# Patient Record
Sex: Male | Born: 1970
Health system: Southern US, Community
[De-identification: ages and names within clinical notes are randomized; demographics above are authoritative.]

## PROBLEM LIST (undated history)

## (undated) DIAGNOSIS — K760 Fatty (change of) liver, not elsewhere classified: Secondary | ICD-10-CM

## (undated) DIAGNOSIS — E785 Hyperlipidemia, unspecified: Secondary | ICD-10-CM

## (undated) HISTORY — DX: Fatty (change of) liver, not elsewhere classified: K76.0

## (undated) HISTORY — PX: HERNIA REPAIR: SHX51

## (undated) HISTORY — PX: KNEE ARTHROSCOPY W/ MENISCAL REPAIR: SHX1877

## (undated) HISTORY — DX: Hyperlipidemia, unspecified: E78.5

---

## 2000-09-29 ENCOUNTER — Encounter: Payer: Self-pay | Admitting: *Deleted

## 2000-09-29 ENCOUNTER — Emergency Department (HOSPITAL_COMMUNITY): Admission: EM | Admit: 2000-09-29 | Discharge: 2000-09-29 | Payer: Self-pay | Admitting: *Deleted

## 2014-11-02 ENCOUNTER — Ambulatory Visit (INDEPENDENT_AMBULATORY_CARE_PROVIDER_SITE_OTHER): Payer: Commercial Managed Care - PPO | Admitting: Internal Medicine

## 2014-11-02 ENCOUNTER — Ambulatory Visit (INDEPENDENT_AMBULATORY_CARE_PROVIDER_SITE_OTHER): Payer: Commercial Managed Care - PPO

## 2014-11-02 VITALS — BP 120/78 | HR 78 | Temp 97.6°F | Resp 16 | Ht 73.25 in | Wt 248.4 lb

## 2014-11-02 DIAGNOSIS — B070 Plantar wart: Secondary | ICD-10-CM

## 2014-11-02 DIAGNOSIS — M79672 Pain in left foot: Secondary | ICD-10-CM

## 2014-11-02 DIAGNOSIS — L989 Disorder of the skin and subcutaneous tissue, unspecified: Secondary | ICD-10-CM | POA: Diagnosis not present

## 2014-11-02 MED ORDER — SALICYLIC ACID 40 % EX PADS: MEDICATED_PAD | CUTANEOUS | Status: DC

## 2014-11-02 NOTE — Progress Notes (Signed)
   Subjective:    Patient ID: Johnathan Bailey, male    DOB: 1970-08-15, 44 y.o.   MRN: [id number]  HPI I, Jule Ser R.T. (R), am scribing for Dr. Lou Miner.   Johnathan Bailey is a 44 y.o. Male presenting today with complaints of left foot pain. He has 2 callused wounds on the plantar surface of his heel. He states they has been present for about 2 months. He denies any knowledge of insect bites or stepping on anything that may have punctured his foot. He states they are very painful. He has used OTC wart cushions that didn't seem to work. He claims the size has expands since he first noticed it.     Review of Systems     Objective:   Physical Exam        Assessment & Plan:

## 2014-11-02 NOTE — Patient Instructions (Signed)
Verrugas plantares (Plantar Wart) Las verrugas son abultamientos benignos (no cancerosos) en la capa exterior de la piel. Pueden aparecer en cualquier momento de la vida, pero son ms frecuentes durante la infancia y Ship broker. Las Building control surveyor en varias zonas de la piel. Cuando se forman en la zona inferior del pie (planta), se denominan verrugas plantares. Con frecuencia aparecen en grupos con varias verrugas pequeas que rodean a una ms grande. CAUSAS El virus del Engineer, technical sales (VPH) es la causa de las verrugas plantares. Este virus se introduce en una herida de la piel del pie. Al caminar descalzo hay una exposicin al virus de la verruga. Las Development worker, international aid plantares tienden a Teaching laboratory technician las zonas que reciben mayor presin, como los talones y la protuberancia que se encuentra debajo del dedo gordo del pie. Las verrugas generalmente se desarrollan en las capas ms profundas de la piel. Pueden diseminarse a otras reas de la planta, pero no pueden contagiar otras zonas del cuerpo. SNTOMAS Tambin puede notar un abultamiento debajo de la superficie del pie. La verruga puede desarrollarse directamente en la planta del pie, o elevarse sobre la superficie de la piel de la planta, o en ambos lugares. Generalmente son planas debido a la presin. En general no causan picazn, pero pueden causar dolor en la zona, al colocar el [employer]. DIAGNSTICO El diagnstico se realiza a travs del examen fsico. Esto significa que el mdico puede descubrirlas al examinar su pie.  TRATAMIENTO Hay muchas formas de tratar las verrugas plantares. [place] ser muy resistentes. En algunos casos es difcil tratarlas pero pueden desaparecer completamente y no volver a crecer. Cualquier tratamiento, para que tenga [place], debe realizarse con regularidad. Si no se tratan, finalmente desaparecern luego de un perodo de [employer]. Los tratamientos caseros pueden ser:  Se ha  demostrado que colocar una cinta adhesiva sobre la superficie de la verruga (oclusin) durante algunos meses, puede ser una solucin efectiva. La cinta adhesiva debe retirarse todas las noches y volverse a Midwife hasta que la verruga haya desaparecido.  Puede ser [employer] aplicar medicamentos de venta libre sobre la verruga para destruir el virus y eliminar el tejido de la verruga (cido acetilsaliclico, cantaridina y Nurse, learning disability). Todos ellos se denominan agentes queratolticos. Estos medicamentos hacen que la piel se [place] y que las capas se desprendan. Estas sustancias deben colocarse sobre la verruga todas las noches y [place] deben cubrirse con [place]. Tambin se dispone de curitas con medicamento. Evite aplicar estos medicamentos en forma lquida en la zona que rodea la piel de las verrugas, ya que pueden causar quemaduras en la piel sana. El tratamiento de aplicacin nocturna puede durar varios meses hasta que produzca algn [place].  Recientemente, la crioterapia como tratamiento para congelar las [place], ha aparecido como medicamento de venta libre para ser utilizado en nios de [place] de 4 aos. En este sistema se utiliza un aplicador delicado y [place], que se conecta a un frasco de lquido fro para ser aplicado directamente sobre la verruga. Este medicamento puede quemar la piel sana y debe utilizarse con precaucin.  Al igual que con otros medicamentos de venta libre, lea cuidadosamente las instrucciones antes de [place]. Los tratamientos que se Psychologist, forensic mdico son:  Algunos tratamientos agresivos pueden causar molestias, cambios en el tono y cicatrices en la piel que rodea la verruga. Los riesgos y beneficios del tratamiento deben comentarse con el mdico.  El congelamiento de la verruga  con nitrgeno lquido (vase ms arriba, crioterapia).  Quemado de la verruga con el uso de calor muy intenso (cauterizacin).  Inyeccin de medicamentos en la  verruga.  Extirpacin [place] o tratamiento con rayo lser.  El mdico podr derivarlo a un dermatlogo si es difcil de tratar, el tamao es muy grande o tiene Education administrator. INSTRUCCIONES PARA EL CUIDADO DOMICILIARIO  Remoje la zona afectada en agua tibia. Luego seque completamente la zona. Retire la capa superficial de piel blanda y aplique el medicamento tpico que haya elegido y vuelva a aplicar el vendaje.  Retire el vendaje todos los das y lime el exceso de tejido de la verruga (la piedra pmez es adecuada para este propsito). Repita el proceso diariamente o da por medio durante algunas semanas hasta que la verruga desaparezca.  Hay algunas marcas disponibles de apsitos de cido saliclico como medicamento de [place].  El dolor puede aliviarse si utiliza un vendaje en forma de rosquilla. Es un vendaje con un Music therapist. Debe colocarlo con el agujero sobre la verruga. Ayuda a quitar presin [employer] y Geographical information systems officer. Para prevenir las verrugas plantares:  Use zapatos y calcetines y cmbielos diariamente.  Mantenga los pies limpios y secos.  Controle sus pies y los pies de sus nios con regularidad.  Evite el contacto directo con las verrugas de Producer, television/film/video.  Consulte al mdico si observa abultamientos o modificaciones en la piel. Document Released: 07/13/2005 Document Revised: 10/05/2011 [facility] Patient Information 2015 [place]. This information is not intended to replace advice given to you by your health care provider. Make sure you discuss any questions you have with your health care provider. Plantar Warts Warts are benign (noncancerous) growths of the outer skin layer. They can occur at any time in life but are most common during childhood and the teen years. Warts can occur on many skin surfaces of the body. When they occur on the underside (sole) of your foot they are called plantar warts. They often emerge in groups with several  small warts encircling a larger growth. CAUSES  Human papillomavirus (HPV) is the cause of plantar warts. HPV attacks a break in the skin of the foot. Walking barefoot can lead to exposure to the wart virus. Plantar warts tend to develop over areas of pressure such as the heel and ball of the foot. Plantar warts often grow into the deeper layers of skin. They may spread to other areas of the sole but cannot spread to other areas of the body. SYMPTOMS  You may also notice a growth on the undersurface of your foot. The wart may grow directly into the sole of the foot, or rise above the surface of the skin on the sole of the foot, or both. They are most often flat from pressure. Warts generally do not cause itching but may cause pain in the area of the wart when you put weight on your foot. DIAGNOSIS  Diagnosis is made by physical examination. This means your caregiver discovers it while examining your foot.  TREATMENT  There are many ways to treat plantar warts. However, warts are very tough. Sometimes it is difficult to treat them so that they go away completely and do not grow back. Any treatment must be done regularly to work. If left untreated, most plantar warts will eventually disappear over a period of one to two years. Treatments you can do at home include:  Putting duct tape over the top of the  wart (occlusion) has been found to be effective over several months. The duct tape should be removed each night and reapplied until the wart has disappeared.  Placing over-the-counter medications on top of the wart to help kill the wart virus and remove the wart tissue (salicylic acid, cantharidin, and dichloroacetic acid) are useful. These are called keratolytic agents. These medications make the skin soft and gradually layers will shed away. These compounds are usually placed on the wart each night and then covered with a bandage. They are also available in premedicated bandage form. Avoid surrounding  skin when applying these liquids as these medications can burn healthy skin. The treatment may take several months of nightly use to be effective.  Cryotherapy to freeze the wart has recently become available over-the-counter for children 4 years and older. This system makes use of a soft narrow applicator connected to a bottle of compressed cold liquid that is applied directly to the wart. This medication can burn healthy skin and should be used with caution.  As with all over-the-counter medications, read the directions carefully before use. Treatments generally done in your caregiver's office include:  Some aggressive treatments may cause discomfort, discoloration, and scarring of the surrounding skin. The risks and benefits of treatment should be discussed with your caregiver.  Freezing the wart with liquid nitrogen (cryotherapy, see above).  Burning the wart with use of very high heat (cautery).  Injecting medication into the wart.  Surgically removing or laser treatment of the wart.  Your caregiver may refer you to a dermatologist for difficult to treat large-sized warts or large numbers of warts. HOME CARE INSTRUCTIONS   Soak the affected area in warm water. Dry the area completely when you are done. Remove the top layer of softened skin, then apply the chosen topical medication and reapply a bandage.  Remove the bandage daily and file excess wart tissue (pumice stone works well for this purpose). Repeat the entire process daily or every other day for weeks until the plantar wart disappears.  Several brands of salicylic acid pads are available as over-the-counter remedies.  Pain can be relieved by wearing a donut bandage. This is a bandage with a hole in it. The bandage is put on with the hole over the wart. This helps take the pressure off the wart and gives pain relief. To help prevent plantar warts:  Wear shoes and socks and change them daily.  Keep feet clean and  dry.  Check your feet and your children's feet regularly.  Avoid direct contact with warts on other people.  Have growths or changes on your skin checked by your caregiver. Document Released: 10/03/2003 Document Revised: 11/27/2013 Document Reviewed: 03/13/2009 [facility] Patient Information 2015 [place], [place]. This information is not intended to replace advice given to you by your health care provider. Make sure you discuss any questions you have with your health care provider.

## 2014-11-02 NOTE — Progress Notes (Signed)
   Subjective:    Patient ID: Johnathan Bailey, male    DOB: 23-Feb-1971, 44 y.o.   MRN: [id number]  HPI Has tender lesion on foot for a long time. Has used salicylic acid otc with some minimal imptovement. Only one lesion.   Review of Systems     Objective:   Physical Exam  Constitutional: He is oriented to person, place, and time. He appears well-developed and well-nourished. No distress.  HENT:  Head: Normocephalic.  Eyes: Pupils are equal, round, and reactive to light. No scleral icterus.  Neck: Normal range of motion.  Pulmonary/Chest: Effort normal.  Neurological: He is alert and oriented to person, place, and time. He exhibits normal muscle tone. Coordination normal.  Skin: Lesion and rash noted. Rash is papular.  64mm lesion mid left calcaneal pad. Classic plantar wart Calloused, no cellulitis  Psychiatric: He has a normal mood and affect.     [facility] reading (PRIMARY) by  Dr.Khushbu Pippen no fb seen       Assessment & Plan:  Plantar wart--single Soaks/Pumis debride/Salysilic acid/Duct tape q 48hrs

## 2016-06-02 ENCOUNTER — Encounter (HOSPITAL_COMMUNITY): Payer: Self-pay | Admitting: Emergency Medicine

## 2016-06-02 ENCOUNTER — Emergency Department (HOSPITAL_COMMUNITY)
Admission: EM | Admit: 2016-06-02 | Discharge: 2016-06-02 | Disposition: A | Discharge status: Home / Self Care | Payer: Worker's Compensation | Attending: Emergency Medicine | Admitting: Emergency Medicine

## 2016-06-02 ENCOUNTER — Emergency Department (HOSPITAL_COMMUNITY): Payer: Worker's Compensation

## 2016-06-02 DIAGNOSIS — Y9301 Activity, walking, marching and hiking: Secondary | ICD-10-CM | POA: Insufficient documentation

## 2016-06-02 DIAGNOSIS — W010XXA Fall on same level from slipping, tripping and stumbling without subsequent striking against object, initial encounter: Secondary | ICD-10-CM | POA: Insufficient documentation

## 2016-06-02 DIAGNOSIS — Y9269 Other specified industrial and construction area as the place of occurrence of the external cause: Secondary | ICD-10-CM | POA: Insufficient documentation

## 2016-06-02 DIAGNOSIS — M25561 Pain in right knee: Secondary | ICD-10-CM | POA: Insufficient documentation

## 2016-06-02 DIAGNOSIS — Y990 Civilian activity done for income or pay: Secondary | ICD-10-CM | POA: Diagnosis not present

## 2016-06-02 MED ORDER — IBUPROFEN 600 MG PO TABS: 600.0000 mg | ORAL_TABLET | Freq: Four times a day (QID) | ORAL | 0 refills | Status: DC | PRN

## 2016-06-02 MED ORDER — HYDROCODONE-ACETAMINOPHEN 5-325 MG PO TABS: 1.0000 | ORAL_TABLET | Freq: Four times a day (QID) | ORAL | 0 refills | Status: DC | PRN

## 2016-06-02 MED ORDER — HYDROCODONE-ACETAMINOPHEN 5-325 MG PO TABS
2.0000 | ORAL_TABLET | Freq: Once | ORAL | Status: AC
  Administered 2016-06-02: 2 via ORAL
  Filled 2016-06-02: qty 2

## 2016-06-02 NOTE — ED Provider Notes (Signed)
[facility] DEPT Provider Note   CSN: [id number] Arrival date & time: 06/02/16  1402  By signing my name below, I, Higinio Plan, attest that this documentation has been prepared under the direction and in the presence of non-physician practitioner, Montine Circle, PA-C. Electronically Signed: Higinio Plan, Scribe. 06/02/2016. 3:42 PM.  History   Chief Complaint Chief Complaint  Patient presents with  . Knee Pain   The history is provided by the patient. No language interpreter was used.   HPI Comments: Johnathan Bailey is a 45 y.o. male who presents to the Emergency Department with a chief complaint of right knee pain s/p a fall that occurred this morning. 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 He notes he is able to ambulate with mild difficulty.   History reviewed. No pertinent past medical history.  There are no active problems to display for this patient.  History reviewed. No pertinent surgical history.  Home Medications    Prior to Admission medications   Medication Sig Start Date End Date Taking? Authorizing Provider  Salicylic Acid 40 % PADS Apply salicylic acid [id number] pad every 48hrs ,cover with duct tape. Soak and debride pryor to application [id number]   Orma Flaming, MD    Family History No family history on file.  Social History Social History  Substance Use Topics  . Smoking status: Never Smoker  . Smokeless tobacco: Not on file  . Alcohol use 0.0 oz/week     Allergies   Patient has no known allergies.  Review of Systems Review of Systems  Constitutional: Negative for fever.  Musculoskeletal: Positive for myalgias (right knee pain).   Physical Exam Updated Vital Signs BP 138/88   Pulse 83   Temp 98.1 F (36.7 C) (Oral)   Resp 18   SpO2 97%   Physical Exam Physical Exam  Constitutional: Pt appears well-developed and well-nourished. No distress.    HENT:  Head: Normocephalic and atraumatic.  Eyes: Conjunctivae are normal.  Neck: Normal range of motion.  Cardiovascular: Normal rate, regular rhythm and intact distal pulses.   Capillary refill < 3 sec  Pulmonary/Chest: Effort normal and breath sounds normal.  Musculoskeletal: Pt exhibits tenderness to palpation of right knee on the medial joint line, joint stability tests deferred 2/2 guarding. Pt exhibits no edema.  ROM: 4/5 limited by pain Neurological: Pt  is alert. Coordination normal.  Sensation 5/5 Strength 4/5 limited by pain  Skin: Skin is warm and dry. Pt is not diaphoretic.  No tenting of the skin  Psychiatric: Pt has a normal mood and affect.  Nursing note and vitals reviewed.  ED Treatments / Results  Labs (all labs ordered are listed, but only abnormal results are displayed) Labs Reviewed - No data to display  EKG  EKG Interpretation None       Radiology Dg Knee Complete 4 Views Right  Result Date: 06/02/2016 CLINICAL DATA:  Fall.  Knee injury EXAM: RIGHT KNEE - COMPLETE 4+ VIEW COMPARISON:  None. FINDINGS: No evidence of fracture, dislocation, or joint effusion. No evidence of arthropathy or other focal bone abnormality. Soft tissues are unremarkable. IMPRESSION: Negative. Electronically Signed   By: Franchot Gallo M.D.   On: 06/02/2016 15:22    Procedures Procedures (including critical care time)  Medications Ordered in ED Medications - No data to display  DIAGNOSTIC STUDIES:  Oxygen Saturation is 97% on RA, normal by  my interpretation.    COORDINATION OF CARE:  3:38 PM Discussed treatment plan with pt at bedside and pt agreed to plan.  Initial Impression / Assessment and Plan / ED Course  I have reviewed the triage vital signs and the nursing notes.  Pertinent labs & imaging results that were available during my care of the patient were reviewed by me and considered in my medical decision making (see chart for details).  Clinical Course      Patient X-Ray negative for obvious fracture or dislocation.  Pt advised to follow up with orthopedics. Patient given knee brace and crutches while in ED, conservative therapy recommended and discussed. Patient will be discharged home & is agreeable with above plan. Returns precautions discussed. Pt appears safe for discharge.   I personally performed the services described in this documentation, which was scribed in my presence. The recorded information has been reviewed and is accurate.   Final Clinical Impressions(s) / ED Diagnoses   Final diagnoses:  Acute pain of right knee    New Prescriptions New Prescriptions   HYDROCODONE-ACETAMINOPHEN (NORCO/VICODIN) 5-325 MG TABLET    Take 1-2 tablets by mouth every 6 (six) hours as needed.   IBUPROFEN (ADVIL,MOTRIN) 600 MG TABLET    Take 1 tablet (600 mg total) by mouth every 6 (six) hours as needed.     Montine Circle, PA-C 06/02/16 1546    Tanna Furry, MD 06/23/16 1524

## 2016-06-02 NOTE — ED Notes (Signed)
Declined W/C at D/C and was escorted to lobby by RN. 

## 2016-06-02 NOTE — ED Notes (Signed)
Pt states he slipped this am, twisting right knee.

## 2016-06-02 NOTE — Discharge Instructions (Signed)
Your may need more evaluation on your knee by a specialist if your symptoms do not improve.  Please call the doctor listed below if still having symptoms after a week.

## 2016-06-02 NOTE — ED Notes (Signed)
Patient transported to X-ray 
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
## 2016-06-02 NOTE — ED Triage Notes (Signed)
Pt was walking up a step while it was raining, he slipped, twisted his right knee and then fell on his rear end. Pt c/o R knee pain, able to ambulate with mild pain.

## 2016-12-07 ENCOUNTER — Encounter: Payer: Self-pay | Admitting: Urgent Care

## 2016-12-07 ENCOUNTER — Ambulatory Visit (INDEPENDENT_AMBULATORY_CARE_PROVIDER_SITE_OTHER): Payer: BLUE CROSS/BLUE SHIELD

## 2016-12-07 ENCOUNTER — Ambulatory Visit (INDEPENDENT_AMBULATORY_CARE_PROVIDER_SITE_OTHER): Payer: BLUE CROSS/BLUE SHIELD | Admitting: Urgent Care

## 2016-12-07 VITALS — BP 128/88 | HR 80 | Temp 98.8°F | Resp 16 | Ht 72.5 in | Wt 251.6 lb

## 2016-12-07 DIAGNOSIS — S97102A Crushing injury of unspecified left toe(s), initial encounter: Secondary | ICD-10-CM

## 2016-12-07 DIAGNOSIS — S99922A Unspecified injury of left foot, initial encounter: Secondary | ICD-10-CM | POA: Diagnosis not present

## 2016-12-07 DIAGNOSIS — S92425A Nondisplaced fracture of distal phalanx of left great toe, initial encounter for closed fracture: Secondary | ICD-10-CM

## 2016-12-07 DIAGNOSIS — S99929A Unspecified injury of unspecified foot, initial encounter: Secondary | ICD-10-CM

## 2016-12-07 MED ORDER — CEPHALEXIN 500 MG PO CAPS: 500.0000 mg | ORAL_CAPSULE | Freq: Three times a day (TID) | ORAL | 0 refills | Status: DC

## 2016-12-07 MED ORDER — TRAMADOL HCL 50 MG PO TABS: 50.0000 mg | ORAL_TABLET | Freq: Three times a day (TID) | ORAL | 0 refills | Status: DC | PRN

## 2016-12-07 NOTE — Progress Notes (Signed)
  MRN: [id number] DOB: 1970-11-26  Subjective:   Johnathan Bailey is a 46 y.o. male presenting for chief complaint of left great toenail (tire fell on foot on Sat. night 12/05/16)  Reports 2 day history of recurrent left great toe pain following another injury to said toe. Patient reports that while working in the garage, a tire fell onto the floor then landed on his left great toe. Has had pain, swelling, bruising since then. He had also injured his toe in December 2017, dropped a drill on it. His great toenail has steadily been falling off since then. He is requesting that we remove his great toe nail today. Has not tried any medications for relief. Denies fever, redness, loss of sensation, warmth.   Johnathan Bailey has a current medication list which includes the following prescription(s): ibuprofen, hydrocodone-acetaminophen, and salicylic acid. Also has No Known Allergies.  Johnathan Bailey  has no past medical history on file. Also  has no past surgical history on file.  Objective:   Vitals: BP 128/88   Pulse 80   Temp 98.8 F (37.1 C) (Oral)   Resp 16   Ht 6' 0.5" (1.842 m)   Wt 251 lb 9.6 oz (114.1 kg)   SpO2 97%   BMI 33.65 kg/m   Physical Exam  Constitutional: He is oriented to person, place, and time. He appears well-developed and well-nourished.  Cardiovascular: Normal rate.   Pulmonary/Chest: Effort normal.  Musculoskeletal:       Left foot: There is decreased range of motion (flexion, extension due to pain per patient) and swelling.       Feet:  Neurological: He is alert and oriented to person, place, and time.   Dg Toe Great Left  Result Date: 12/07/2016 CLINICAL DATA:  Toe injury, crush injury. EXAM: LEFT GREAT TOE COMPARISON:  11/02/2014 FINDINGS: Linear lucency through the base of the left great toe distal phalanx concerning for nondisplaced fracture. This involves the IP joint. No subluxation or dislocation. Joint spaces are maintained. IMPRESSION: Nondisplaced fracture through the  distal phalanx of the left great toe entering the IP joint. Electronically Signed   By: Rolm Baptise M.D.   On: 12/07/2016 12:21   PROCEDURE NOTE: Toenail Removal  Verbal Consent Obtained. Left great toe wiped with alcohol prep pad, then digital block with 8cc of 0.5% Marcaine. Sterile prep and drape. Non-viable left great toenail removed with reduction of subungual hematoma in the process. New toenail was noted to be loose as well. This was also removed and cleansed in sterile water. Newer nail was placed back onto great toe as a splint for protection. Non-viable tissue debrided. No active bleeding. Cleansed and dressed. Wound care instructions including precautions reviewed with patient.   Assessment and Plan :   This case was precepted with Dr. Mitchel Honour.   1. Toe injury, left, initial encounter 2. Crushing injury of toe of left foot, initial encounter 3. Injury of nail bed of toe 4. Nondisplaced fracture of distal phalanx of left great toe, initial encounter for closed fracture - Counseled on toe fracture management. Start Keflex. Use hard soled shoe. 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   Jaynee Eagles, PA-C [facility] [phone] 12/07/2016  11:16 AM

## 2016-12-07 NOTE — Patient Instructions (Addendum)
Tome 500mg  de Tylenol con ibuprofen 400-600mg  cada 6 horas con comida para dolor y inflammacion.     Fractura de un dedo del pie (Toe Fracture) Una fractura de un dedo del pie es una quebradura en uno de los huesos de los dedos del pie (falanges). CAUSAS Esta afeccin puede ser causada por lo siguiente:  La cada de un objeto pesado sobre el dedo del pie.  Un golpe en el dedo del pie.  El uso excesivo del dedo del pie o la realizacin de ejercicios repetitivos.  La torsin o el estiramiento del dedo del pie con desplazamiento. FACTORES DE RIESGO Es ms probable que esta afeccin se manifieste en las personas que:  Therapist, occupational deportes de Diplomatic Services operational officer.  Tienen una enfermedad [employer].  Tienen bajos niveles de calcio. SNTOMAS Los principales sntomas de esta afeccin son la hinchazn y el dolor del dedo del pie. El dolor puede intensificarse al estar parado o caminar. Otros sntomas pueden ser los siguientes:  Hematomas.  Rigidez.  Entumecimiento.  Un cambio en el aspecto del dedo del pie.  Huesos fracturados que [employer].  Sangre debajo de la ua del pie. DIAGNSTICO Esta afeccin se diagnostica mediante un examen fsico. Tambin puede ser necesario que le tomen radiografas. TRATAMIENTO El tratamiento de esta afeccin depende del tipo de fractura y de la gravedad. El tratamiento puede incluir lo siguiente:  Vendar el dedo fracturado del pie junto con un dedo adyacente (vendaje de inmovilizacin). Este es el tratamiento ms frecuente de las fracturas en las que el hueso no se ha desplazado de su lugar (fractura sin desplazamiento).  Usar un calzado con suela ancha y rgida para proteger el dedo del pie y limitar su movimiento.  Usar un yeso para caminar.  Someterse a un procedimiento para reacomodar el dedo del pie.  Ciruga. Esta puede ser necesaria en los siguientes casos:  Si hay muchos fragmentos de hueso fracturado que estn fuera de su lugar  (desplazados).  Si se fractura la articulacin del dedo del pie.  Si el hueso atraviesa la piel.  Fisioterapia. Esta se realiza para ayudar a Estate manager/land agent movimiento y la fuerza del dedo del pie. Tal vez deba hacerse radiografas de control para asegurarse de que el hueso se est consolidando bien y se Quarry manager en su posicin. INSTRUCCIONES PARA EL CUIDADO EN EL HOGAR Si tiene un yeso:  No introduzca nada adentro del yeso para rascarse la piel. Esto puede aumentar el riesgo de tener infecciones.  De Land piel de alrededor del yeso. Informe al mdico cualquier inquietud que tenga. Puede aplicar una locin en la piel seca alrededor de los bordes del yeso. No aplique locin en la piel por debajo del yeso.  No ejerza presin en ninguna parte del yeso hasta que se haya endurecido por completo. Esto puede tomar [employer].  Mantenga el yeso seco y limpio. El bao  No tome baos de inmersin, no nade ni use el jacuzzi hasta que el mdico lo autorice. Pregntele al mdico si puede ducharse. Thurston Pounds solo le permitan tomar baos de [place].  Si el mdico lo autoriza a que tome baos de inmersin y se duche, [place] el yeso o la venda (vendaje) con una bolsa de plstico hermtica para protegerlos del agua. No permita que el yeso o el vendaje se mojen. Control del dolor, la rigidez y la hinchazn  Si no tiene un yeso, aplique hielo sobre la zona de la lesin, si se lo indicaron.  Ponga el hielo en una bolsa plstica.  Coloque una toalla entre la piel y la bolsa de hielo.  Coloque el hielo durante 8minutos, 2 a 3veces por Training and development officer.  Mueva los dedos de los pies con frecuencia para evitar que se entumezcan y para reducir la hinchazn.  Cuando est sentado o acostado, eleve la zona de la lesin por encima del nivel del corazn. Conducir  No conduzca ni opere maquinaria pesada mientras toma analgsicos.  No conduzca mientras [place] un yeso en un pie. Actividad  Reanude sus  actividades normales como se lo haya indicado el mdico. Pregntele al mdico qu actividades son seguras para usted.  Haga ejercicio a diario como se lo haya indicado el fisioterapeuta o el mdico. Seguridad  No apoye el peso del cuerpo sobre la extremidad lesionada hasta que lo autorice el mdico. Use muletas u otros dispositivos de Runner, broadcasting/film/video se lo haya indicado el mdico. Instrucciones generales  Si se trat el dedo con un vendaje de inmovilizacin, siga las indicaciones del mdico en lo que respecta al cambio de la gasa y la [place]. Cmbielas con ms frecuencia:  Si la gasa y la [place] se mojan. Si esto ocurre, seque el [employer].  Si la gasa y la cinta adhesiva estn muy ajustadas y hacen que el dedo del pie se torne plido o se entumezca.  Use calzado protector como se lo haya indicado el mdico. Si no le indicaron un calzado de este tipo, use uno que sea resistente y tenga buen 79. El calzado no debe comprimirle ni apretarle los dedos.  No consuma ningn producto que contenga tabaco, lo que incluye cigarrillos, tabaco de Higher education careers adviser o Psychologist, sport and exercise. El tabaco puede retardar la consolidacin de la fractura. Si necesita ayuda para dejar de fumar, consulte al mdico.  Tome los medicamentos solamente como se lo haya indicado el mdico.  Concurra a todas las visitas de control como se lo haya indicado el mdico. Esto es importante. SOLICITE ATENCIN MDICA SI:  Jaclynn Guarneri.  El medicamento no Production designer, theatre/television/film.  El dedo del pie est fro.  El dedo del pie est entumecido.  Sigue teniendo dolor despus de una semana de reposo y [place].  Sigue teniendo [employer] de que el mdico le haya indicado que puede empezar a caminar nuevamente.  Siente dolor, hormigueo o entumecimiento en el pie, y estos sntomas no desaparecen. SOLICITE ATENCIN MDICA DE INMEDIATO SI:  Siente dolor intenso.  Tiene enrojecimiento o inflamacin en el  dedo del pie, y estos sntomas empeoran.  Siente dolor o entumecimiento en el dedo del pie, y estos sntomas empeoran.  El dedo del pie se torna de color [place]. Esta informacin no tiene Marine scientist el consejo del mdico. Asegrese de hacerle al mdico cualquier pregunta que tenga. Document Released: 04/22/2005 Document Revised: 11/04/2015 Document Reviewed: 05/09/2014 Elsevier Interactive Patient Education  2017 [employer].     IF you received an x-ray today, you will receive an invoice from [facility]. Please contact [facility] at [phone] with questions or concerns regarding your invoice.   IF you received labwork today, you will receive an invoice from [facility]. Please contact [facility] at [phone] with questions or concerns regarding your invoice.   Our billing staff will not be able to assist you with questions regarding bills from these companies.  You will be contacted with the lab results as soon as they are available. The fastest way to get your results is to  activate your My Chart account. Instructions are located on the last page of this paperwork. If you have not heard from [place] regarding the results in 2 weeks, please contact this office.

## 2017-06-30 ENCOUNTER — Telehealth: Payer: Self-pay

## 2017-06-30 ENCOUNTER — Ambulatory Visit: Payer: BLUE CROSS/BLUE SHIELD | Admitting: Family Medicine

## 2017-06-30 ENCOUNTER — Encounter: Payer: Self-pay | Admitting: Family Medicine

## 2017-06-30 VITALS — BP 132/80 | HR 74 | Temp 98.7°F | Ht 72.5 in | Wt 253.4 lb

## 2017-06-30 DIAGNOSIS — M7741 Metatarsalgia, right foot: Secondary | ICD-10-CM | POA: Insufficient documentation

## 2017-06-30 DIAGNOSIS — Z0001 Encounter for general adult medical examination with abnormal findings: Secondary | ICD-10-CM | POA: Diagnosis not present

## 2017-06-30 DIAGNOSIS — Z0000 Encounter for general adult medical examination without abnormal findings: Secondary | ICD-10-CM

## 2017-06-30 LAB — URINALYSIS, ROUTINE W REFLEX MICROSCOPIC
BILIRUBIN URINE: NEGATIVE
KETONES UR: NEGATIVE
Leukocytes, UA: NEGATIVE
Nitrite: NEGATIVE
PH: 5.5 (ref 5.0–8.0)
RBC / HPF: NONE SEEN (ref 0–?)
Specific Gravity, Urine: 1.025 (ref 1.000–1.030)
TOTAL PROTEIN, URINE-UPE24: NEGATIVE
URINE GLUCOSE: NEGATIVE
UROBILINOGEN UA: 0.2 (ref 0.0–1.0)
WBC UA: NONE SEEN (ref 0–?)

## 2017-06-30 LAB — COMPREHENSIVE METABOLIC PANEL
ALT: 28 U/L (ref 0–53)
AST: 19 U/L (ref 0–37)
Albumin: 4.8 g/dL (ref 3.5–5.2)
Alkaline Phosphatase: 69 U/L (ref 39–117)
BILIRUBIN TOTAL: 1.3 mg/dL — AB (ref 0.2–1.2)
BUN: 17 mg/dL (ref 6–23)
CO2: 27 meq/L (ref 19–32)
Calcium: 9.3 mg/dL (ref 8.4–10.5)
Chloride: 101 mEq/L (ref 96–112)
Creatinine, Ser: 0.75 mg/dL (ref 0.40–1.50)
GFR: 119.26 mL/min (ref >60.00)
GLUCOSE: 99 mg/dL (ref 70–99)
Potassium: 4.1 mEq/L (ref 3.5–5.1)
Sodium: 137 mEq/L (ref 135–145)
Total Protein: 7.6 g/dL (ref 6.0–8.3)

## 2017-06-30 LAB — CBC
HCT: 47.5 % (ref 39.0–52.0)
HEMOGLOBIN: 16.2 g/dL (ref 13.0–17.0)
MCHC: 34.1 g/dL (ref 30.0–36.0)
MCV: 86.6 fl (ref 78.0–100.0)
Platelets: 291 10*3/uL (ref 150.0–400.0)
RBC: 5.49 Mil/uL (ref 4.22–5.81)
RDW: 12.6 % (ref 11.5–15.5)
WBC: 7 10*3/uL (ref 4.0–10.5)

## 2017-06-30 LAB — TSH: TSH: 1.64 u[IU]/mL (ref 0.35–4.50)

## 2017-06-30 LAB — LIPID PANEL
CHOL/HDL RATIO: 6
Cholesterol: 213 mg/dL — ABNORMAL HIGH (ref 0–200)
HDL: 36.4 mg/dL — AB (ref >39.00)
Triglycerides: 606 mg/dL — ABNORMAL HIGH (ref 0.0–149.0)

## 2017-06-30 LAB — LDL CHOLESTEROL, DIRECT: Direct LDL: 105 mg/dL

## 2017-06-30 MED ORDER — MELOXICAM 15 MG PO TABS: 15.0000 mg | ORAL_TABLET | Freq: Every day | ORAL | 1 refills | Status: DC

## 2017-06-30 NOTE — Progress Notes (Addendum)
Subjective:  Patient ID: Johnathan Bailey, male    DOB: 12/28/1970  Age: 46 y.o. MRN: [id number]  CC: [name] presents for establishment of care and evaluation of foot pain.  He is fasting this morning.  He tells of a past medical history of elevated triglycerides.  These have been treated with dietary therapy in the past but he admits current dietary indiscretion.  He is married and has 3 children, the oldest is 77.  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  His group actually built our building he tells me.  His parents are in their 38s and in good health.  His dad told him that he was in the early stages of diabetes.  Patient does not smoke and drinks 1 alcoholic drink daily.  For some time now he has been having pain in the balls of his feet particularly in the right foot.  There is been no injury.  He is status post meniscus repair of his right knee. Wears seat belts.   History Johnathan Bailey has no past medical history on file.   He has no past surgical history on file.   His family history is not on file.He reports that  has never smoked. he has never used smokeless tobacco. He reports that he drinks alcohol. He reports that he does not use drugs.  Outpatient Medications Prior to Visit  Medication Sig Dispense Refill  . cephALEXin (KEFLEX) 500 MG capsule Take 1 capsule (500 mg total) by mouth 3 (three) times daily. 30 capsule 0  . HYDROcodone-acetaminophen (NORCO/VICODIN) 5-325 MG tablet Take 1-2 tablets by mouth every 6 (six) hours as needed. (Patient not taking: Reported on 12/07/2016) 10 tablet 0  . ibuprofen (ADVIL,MOTRIN) 600 MG tablet Take 1 tablet (600 mg total) by mouth every 6 (six) hours as needed. 30 tablet 0  . Salicylic Acid 40 % PADS Apply salicylic acid 40% pad every 48hrs ,cover with duct tape. Soak and debride pryor to application (Patient not taking: Reported on 12/07/2016) 6 each 1  . traMADol (ULTRAM) 50 MG tablet Take 1 tablet  (50 mg total) by mouth every 8 (eight) hours as needed. 30 tablet 0   No facility-administered medications prior to visit.     ROS Review of Systems  Constitutional: Negative.   HENT: Negative.   Eyes: Negative.   Respiratory: Negative.   Cardiovascular: Negative.   Gastrointestinal: Negative.   Endocrine: Negative for polyphagia and polyuria.  Genitourinary: Negative for frequency.  Musculoskeletal: Positive for arthralgias.  Skin: Negative for color change, rash and wound.  Allergic/Immunologic: Negative for immunocompromised state.  Neurological: Negative for weakness and headaches.  Hematological: Negative.   Psychiatric/Behavioral: Negative.     Objective:  BP 132/80 (BP Location: Left Arm, Patient Position: Sitting, Cuff Size: Normal)   Pulse 74   Temp 98.7 F (37.1 C) (Oral)   Ht 6' 0.5" (1.842 m)   Wt 253 lb 6 oz (114.9 kg)   SpO2 97%   BMI 33.89 kg/m   Physical Exam  Constitutional: He is oriented to person, place, and time. He appears well-developed and well-nourished. No distress.  HENT:  Head: Normocephalic and atraumatic.  Right Ear: External ear normal.  Left Ear: External ear normal.  Mouth/Throat: Oropharynx is clear and moist. No oropharyngeal exudate.  Eyes: Conjunctivae are normal. Pupils are equal, round, and reactive to light. Right eye exhibits no discharge. Left eye exhibits no discharge. No scleral icterus.  Neck: Neck supple. No JVD present. No tracheal deviation present. No thyromegaly present.  Cardiovascular: Normal rate, regular rhythm and normal heart sounds.  Pulses:      Dorsalis pedis pulses are 2+ on the right side.       Posterior tibial pulses are 1+ on the right side.  Pulmonary/Chest: Effort normal and breath sounds normal. No stridor.  Abdominal: Soft. Bowel sounds are normal.  Musculoskeletal:       Right foot: There is tenderness. There is normal range of motion, normal capillary refill and no deformity.        Feet:  Lymphadenopathy:    He has no cervical adenopathy.  Neurological: He is alert and oriented to person, place, and time.  Skin: Skin is warm and dry. He is not diaphoretic.  Psychiatric: He has a normal mood and affect. His behavior is normal.      Assessment & Plan:   Johnathan Bailey was seen today for establish care.  Diagnoses and all orders for this visit:  Metatarsalgia of right foot -     meloxicam (MOBIC) 15 MG tablet; Take 1 tablet (15 mg total) by mouth daily. With food for 10 days and then as needed.  Healthcare maintenance -     CBC -     Comprehensive metabolic panel -     Lipid panel -     TSH -     Urinalysis, Routine w reflex microscopic -     HIV antibody   I have discontinued Johnathan Bailey's Salicylic Acid, HYDROcodone-acetaminophen, ibuprofen, cephALEXin, and traMADol. I am also having him start on meloxicam.  Meds ordered this encounter  Medications  . meloxicam (MOBIC) 15 MG tablet    Sig: Take 1 tablet (15 mg total) by mouth daily. With food for 10 days and then as needed.    Dispense:  30 tablet    Refill:  1   Advised patient to obtain metatarsal pads at the pharmacy.  I discussed with them where they should be placed.  Discussed application of his pes planus foot.  Will offer referral to our sports medicine doctor if these do not help his foot pain.  Labs are pending and we will be particularly interested in his triglycerides.  Follow-up: Return in about 2 weeks (around 07/14/2017), or if symptoms worsen or fail to improve.  [name], MD

## 2017-06-30 NOTE — Telephone Encounter (Signed)
I called patient and we went over his lab results. He is wanting to know which pad you recommend that he gets for his foot. He is wanting to know if he should get the Dr. Felicie Morn and if so, which one is the best one to get.    Please advise, thanks!

## 2017-07-01 LAB — HIV ANTIBODY (ROUTINE TESTING W REFLEX): HIV 1&2 Ab, 4th Generation: NONREACTIVE

## 2017-07-01 NOTE — Telephone Encounter (Signed)
I left a voicemail for patient to return phone call. Per Dr. Ethelene Hal - patient can pick up metatarsal pads. If unable to find them, he can use the general Dr. Felicie Morn insert.

## 2017-07-02 NOTE — Telephone Encounter (Signed)
Patient has been given recommendations from Dr. Ethelene Hal and will call back if he has any questions.

## 2017-12-07 ENCOUNTER — Encounter: Payer: Self-pay | Admitting: Family Medicine

## 2017-12-07 ENCOUNTER — Ambulatory Visit: Payer: BLUE CROSS/BLUE SHIELD | Admitting: Family Medicine

## 2017-12-07 VITALS — BP 126/82 | HR 78 | Ht 72.5 in | Wt 256.0 lb

## 2017-12-07 DIAGNOSIS — S161XXA Strain of muscle, fascia and tendon at neck level, initial encounter: Secondary | ICD-10-CM | POA: Insufficient documentation

## 2017-12-07 MED ORDER — METHOCARBAMOL 500 MG PO TABS: 500.0000 mg | ORAL_TABLET | Freq: Three times a day (TID) | ORAL | 1 refills | Status: DC

## 2017-12-07 MED ORDER — MELOXICAM 15 MG PO TABS: 15.0000 mg | ORAL_TABLET | Freq: Every day | ORAL | 0 refills | Status: DC

## 2017-12-07 NOTE — Progress Notes (Signed)
Subjective:  Patient ID: Johnathan Bailey, male    DOB: June 10, 1971  Age: 47 y.o. MRN: [id number]  CC: pain in left side of neck   HPI Johnathan Bailey presents for evaluation of the of the pain in his left lateral neck.  He believes that he injured it a few months ago while cheering his son on at his soccer game.  His son was knocked down by another player patient became upset and thinks that he strained his neck as a result.  Denies traumatic injury.  There is no radiation of pain.  There is no weakness or paresthesias in the upper extremities.  There are no headaches.  He has tried no medicines for this.  No fever or chills or rash.  He is recovering from a recent viral syndrome.  Outpatient Medications Prior to Visit  Medication Sig Dispense Refill  . meloxicam (MOBIC) 15 MG tablet Take 1 tablet (15 mg total) by mouth daily. With food for 10 days and then as needed. 30 tablet 1   No facility-administered medications prior to visit.     ROS Review of Systems  Constitutional: Negative for chills, fever and unexpected weight change.  HENT: Negative.   Eyes: Negative.   Respiratory: Negative.   Cardiovascular: Negative.   Gastrointestinal: Negative.   Musculoskeletal: Positive for myalgias, neck pain and neck stiffness.  Skin: Negative for color change and rash.  Allergic/Immunologic: Negative for immunocompromised state.  Neurological: Negative for weakness, numbness and headaches.  Hematological: Does not bruise/bleed easily.  Psychiatric/Behavioral: Negative.     Objective:  BP 126/82   Pulse 78   Ht 6' 0.5" (1.842 m)   Wt 256 lb (116.1 kg)   SpO2 98%   BMI 34.24 kg/m   BP Readings from Last 3 Encounters:  12/07/17 126/82  06/30/17 132/80  12/07/16 128/88    Wt Readings from Last 3 Encounters:  12/07/17 256 lb (116.1 kg)  06/30/17 253 lb 6 oz (114.9 kg)  12/07/16 251 lb 9.6 oz (114.1 kg)    Physical Exam  Constitutional: He is oriented to person,  place, and time. He appears well-developed and well-nourished. No distress.  HENT:  Head: Normocephalic and atraumatic.  Right Ear: External ear normal.  Mouth/Throat: Oropharynx is clear and moist. No oropharyngeal exudate.  Eyes: Pupils are equal, round, and reactive to light. Conjunctivae and EOM are normal. Right eye exhibits no discharge. Left eye exhibits no discharge. No scleral icterus.  Neck: Normal range of motion. Neck supple. No JVD present. No tracheal deviation present. No thyromegaly present.  Cardiovascular: Normal rate, regular rhythm and normal heart sounds.  Pulmonary/Chest: Effort normal and breath sounds normal.  Abdominal: Bowel sounds are normal.  Musculoskeletal: He exhibits no edema or deformity.       Right shoulder: Normal.       Left shoulder: Normal.       Cervical back: He exhibits pain (mild ttp of lef tlateral sternoclidomastoid muscle.). He exhibits normal range of motion, no tenderness, no bony tenderness, no swelling and no deformity.  Lymphadenopathy:    He has no cervical adenopathy.  Neurological: He is alert and oriented to person, place, and time.  Skin: Skin is warm and dry. He is not diaphoretic.  Psychiatric: He has a normal mood and affect. His behavior is normal.    Lab Results  Component Value Date   WBC 7.0 06/30/2017   HGB 16.2 06/30/2017   HCT 47.5 06/30/2017   PLT  291.0 06/30/2017   GLUCOSE 99 06/30/2017   CHOL 213 (H) 06/30/2017   TRIG (H) 06/30/2017    606.0 Triglyceride is over 400; calculations on Lipids are invalid.   HDL 36.40 (L) 06/30/2017   LDLDIRECT 105.0 06/30/2017   ALT 28 06/30/2017   AST 19 06/30/2017   NA 137 06/30/2017   K 4.1 06/30/2017   CL 101 06/30/2017   CREATININE 0.75 06/30/2017   BUN 17 06/30/2017   CO2 27 06/30/2017   TSH 1.64 06/30/2017    Dg Knee Complete 4 Views Right  Result Date: 06/02/2016 CLINICAL DATA:  Fall.  Knee injury EXAM: RIGHT KNEE - COMPLETE 4+ VIEW COMPARISON:  None. FINDINGS: No  evidence of fracture, dislocation, or joint effusion. No evidence of arthropathy or other focal bone abnormality. Soft tissues are unremarkable. IMPRESSION: Negative. Electronically Signed   By: Franchot Gallo M.D.   On: 06/02/2016 15:22    Assessment & Plan:   [place] was seen today for pain in left side of neck.  Diagnoses and all orders for this visit:  Cervical muscle strain, initial encounter -     methocarbamol (ROBAXIN) 500 MG tablet; Take 1 tablet (500 mg total) by mouth 3 (three) times daily. For 2 weeks. -     meloxicam (MOBIC) 15 MG tablet; Take 1 tablet (15 mg total) by mouth daily. For 2 weeks.   I have discontinued Johnathan Bailey's meloxicam. I am also having him start on methocarbamol and meloxicam.  Meds ordered this encounter  Medications  . methocarbamol (ROBAXIN) 500 MG tablet    Sig: Take 1 tablet (500 mg total) by mouth 3 (three) times daily. For 2 weeks.    Dispense:  60 tablet    Refill:  1  . meloxicam (MOBIC) 15 MG tablet    Sig: Take 1 tablet (15 mg total) by mouth daily. For 2 weeks.    Dispense:  30 tablet    Refill:  0   Patient was instructed to take Mobic and Robaxin regularly for 2 weeks.  He will also follow with the neck exercises given to him.  He will let me know if he is not better in 2 weeks and will consider sports medicine referral.  Follow-up: No follow-ups on file.  [name], MD

## 2017-12-07 NOTE — Patient Instructions (Signed)
Cervical Strain and Sprain Rehab Ask your health care provider which exercises are safe for you. Do exercises exactly as told by your health care provider and adjust them as directed. It is normal to feel mild stretching, pulling, tightness, or discomfort as you do these exercises, but you should stop right away if you feel sudden pain or your pain gets worse.Do not begin these exercises until told by your health care provider. Stretching and range of motion exercises These exercises warm up your muscles and joints and improve the movement and flexibility of your neck. These exercises also help to relieve pain, numbness, and tingling. Exercise A: Cervical side bend  1. Using good posture, sit on a stable chair or stand up. 2. Without moving your shoulders, slowly tilt your left / right ear to your shoulder until you feel a stretch in your neck muscles. You should be looking straight ahead. 3. Hold for __________ seconds. 4. Repeat with the other side of your neck. Repeat __________ times. Complete this exercise __________ times a day. Exercise B: Cervical rotation  1. Using good posture, sit on a stable chair or stand up. 2. Slowly turn your head to the side as if you are looking over your left / right shoulder. ? Keep your eyes level with the ground. ? Stop when you feel a stretch along the side and the back of your neck. 3. Hold for __________ seconds. 4. Repeat this by turning to your other side. Repeat __________ times. Complete this exercise __________ times a day. Exercise C: Thoracic extension and pectoral stretch 1. Roll a towel or a small blanket so it is about 4 inches (10 cm) in diameter. 2. Lie down on your back on a firm surface. 3. Put the towel lengthwise, under your spine in the middle of your back. It should not be not under your shoulder blades. The towel should line up with your spine from your middle back to your lower back. 4. Put your hands behind your head and let your  elbows fall out to your sides. 5. Hold for __________ seconds. Repeat __________ times. Complete this exercise __________ times a day. Strengthening exercises These exercises build strength and endurance in your neck. Endurance is the ability to use your muscles for a long time, even after your muscles get tired. Exercise D: Upper cervical flexion, isometric 1. Lie on your back with a thin pillow behind your head and a small rolled-up towel under your neck. 2. Gently tuck your chin toward your chest and nod your head down to look toward your feet. Do not lift your head off the pillow. 3. Hold for __________ seconds. 4. Release the tension slowly. Relax your neck muscles completely before you repeat this exercise. Repeat __________ times. Complete this exercise __________ times a day. Exercise E: Cervical extension, isometric  1. Stand about 6 inches (15 cm) away from a wall, with your back facing the wall. 2. Place a soft object, about 6-8 inches (15-20 cm) in diameter, between the back of your head and the wall. A soft object could be a small pillow, a ball, or a folded towel. 3. Gently tilt your head back and press into the soft object. Keep your jaw and forehead relaxed. 4. Hold for __________ seconds. 5. Release the tension slowly. Relax your neck muscles completely before you repeat this exercise. Repeat __________ times. Complete this exercise __________ times a day. Posture and body mechanics  Body mechanics refers to the movements and positions of   your body while you do your daily activities. Posture is part of body mechanics. Good posture and healthy body mechanics can help to relieve stress in your body's tissues and joints. Good posture means that your spine is in its natural S-curve position (your spine is neutral), your shoulders are pulled back slightly, and your head is not tipped forward. The following are general guidelines for applying improved posture and body mechanics to  your everyday activities. Standing  When standing, keep your spine neutral and keep your feet about hip-width apart. Keep a slight bend in your knees. Your ears, shoulders, and hips should line up.  When you do a task in which you stand in one place for a long time, place one foot up on a stable object that is 2-4 inches (5-10 cm) high, such as a footstool. This helps keep your spine neutral. Sitting   When sitting, keep your spine neutral and your keep feet flat on the floor. Use a footrest, if necessary, and keep your thighs parallel to the floor. Avoid rounding your shoulders, and avoid tilting your head forward.  When working at a desk or a computer, keep your desk at a height where your hands are slightly lower than your elbows. Slide your chair under your desk so you are close enough to maintain good posture.  When working at a computer, place your monitor at a height where you are looking straight ahead and you do not have to tilt your head forward or downward to look at the screen. Resting When lying down and resting, avoid positions that are most painful for you. Try to support your neck in a neutral position. You can use a contour pillow or a small rolled-up towel. Your pillow should support your neck but not push on it. This information is not intended to replace advice given to you by your health care provider. Make sure you discuss any questions you have with your health care provider. Document Released: 07/13/2005 Document Revised: 03/19/2016 Document Reviewed: 06/19/2015 Elsevier Interactive Patient Education  2018 [employer]  

## 2018-11-10 ENCOUNTER — Telehealth: Payer: Self-pay | Admitting: Behavioral Health

## 2018-11-10 ENCOUNTER — Encounter: Payer: Self-pay | Admitting: Family Medicine

## 2018-11-10 ENCOUNTER — Ambulatory Visit (INDEPENDENT_AMBULATORY_CARE_PROVIDER_SITE_OTHER): Payer: Commercial Managed Care - PPO | Admitting: Family Medicine

## 2018-11-10 ENCOUNTER — Telehealth: Payer: Self-pay

## 2018-11-10 VITALS — Ht 72.5 in

## 2018-11-10 DIAGNOSIS — E782 Mixed hyperlipidemia: Secondary | ICD-10-CM | POA: Diagnosis not present

## 2018-11-10 DIAGNOSIS — S161XXS Strain of muscle, fascia and tendon at neck level, sequela: Secondary | ICD-10-CM | POA: Diagnosis not present

## 2018-11-10 MED ORDER — METHOCARBAMOL 500 MG PO TABS: 500.0000 mg | ORAL_TABLET | Freq: Three times a day (TID) | ORAL | 1 refills | Status: DC

## 2018-11-10 MED ORDER — MELOXICAM 15 MG PO TABS: 15.0000 mg | ORAL_TABLET | Freq: Every day | ORAL | 0 refills | Status: DC

## 2018-11-10 NOTE — Telephone Encounter (Signed)
Questions for Screening COVID-19  Symptom onset: None  Travel or Contacts: No recent travel  During this illness, did/does the patient experience any of the following symptoms? Fever >100.78F []   Yes [x]   No []   Unknown Subjective fever (felt feverish) []   Yes [x]   No []   Unknown Chills []   Yes [x]   No []   Unknown Muscle aches (myalgia) []   Yes [x]   No []   Unknown Runny nose (rhinorrhea) []   Yes [x]   No []   Unknown Sore throat []   Yes [x]   No []   Unknown Cough (new onset or worsening of chronic cough) []   Yes [x]   No []   Unknown Shortness of breath (dyspnea) []   Yes [x]   No []   Unknown Nausea or vomiting []   Yes [x]   No []   Unknown Headache []   Yes [x]   No []   Unknown Abdominal pain  []   Yes [x]   No []   Unknown Diarrhea (?3 loose/looser than normal stools/24hr period) []   Yes [x]   No []   Unknown Other, specify:  Patient risk factors: Smoker? []   Current []   Former []   Never If male, currently pregnant? []   Yes []   No  Patient Active Problem List   Diagnosis Date Noted  . Elevated triglycerides with high cholesterol 11/10/2018  . Cervical strain 12/07/2017  . Metatarsalgia of right foot 06/30/2017  . Healthcare maintenance 06/30/2017    Plan:  []   High risk for COVID-19 with red flags go to ED (with CP, SOB, weak/lightheaded, or fever > 101.5). Call ahead.  []   High risk for COVID-19 but stable. Inform provider and coordinate time for [facility] visit.   []   No red flags but URI signs or symptoms okay for [facility] visit.

## 2018-11-10 NOTE — Progress Notes (Addendum)
Established Patient Office Visit  Subjective:  Patient ID: Johnathan Bailey, male    DOB: 02/10/1971  Age: 48 y.o. MRN: [id number]  CC:  Chief Complaint  Patient presents with  . pain in neck    HPI Johnathan Bailey presents for evaluation and treatment of all going left lateral neck pain for the last several months.  Pain is been intermittent and it does not move into his left shoulder.  Patient is right-hand dominant.  He denies weakness or paresthesias in the left arm.  Denies specific injury.  He does work Architect.  This seemed to start after he had been cheering loudly at his son's soccer game.   History reviewed. No pertinent past medical history.  History reviewed. No pertinent surgical history.  History reviewed. No pertinent family history.  Social History   Socioeconomic History  . Marital status: Single    Spouse name: Not on file  . Number of children: Not on file  . Years of education: Not on file  . Highest education level: Not on file  Occupational History  . Not on file  Social Needs  . Financial resource strain: Not on file  . Food insecurity:    Worry: Not on file    Inability: Not on file  . Transportation needs:    Medical: Not on file    Non-medical: Not on file  Tobacco Use  . Smoking status: Never Smoker  . Smokeless tobacco: Never Used  Substance and Sexual Activity  . Alcohol use: Yes    Alcohol/week: 0.0 standard drinks  . Drug use: No  . Sexual activity: Not on file  Lifestyle  . Physical activity:    Days per week: Not on file    Minutes per session: Not on file  . Stress: Not on file  Relationships  . Social connections:    Talks on phone: Not on file    Gets together: Not on file    Attends religious service: Not on file    Active member of club or organization: Not on file    Attends meetings of clubs or organizations: Not on file    Relationship status: Not on file  . Intimate partner violence:    Fear of  current or ex partner: Not on file    Emotionally abused: Not on file    Physically abused: Not on file    Forced sexual activity: Not on file  Other Topics Concern  . Not on file  Social History Narrative  . Not on file    Outpatient Medications Prior to Visit  Medication Sig Dispense Refill  . meloxicam (MOBIC) 15 MG tablet Take 1 tablet (15 mg total) by mouth daily. For 2 weeks. 30 tablet 0  . methocarbamol (ROBAXIN) 500 MG tablet Take 1 tablet (500 mg total) by mouth 3 (three) times daily. For 2 weeks. 60 tablet 1   No facility-administered medications prior to visit.     No Known Allergies  ROS Review of Systems  Respiratory: Negative.   Cardiovascular: Negative.   Gastrointestinal: Negative.   Musculoskeletal: Positive for neck pain.  Neurological: Negative for weakness and numbness.      Objective:    Physical Exam  Constitutional: He is oriented to person, place, and time. He appears well-developed and well-nourished. No distress.  HENT:  Head: Normocephalic and atraumatic.  Right Ear: External ear normal.  Left Ear: External ear normal.  Eyes: Right eye exhibits no discharge. Left  eye exhibits no discharge. No scleral icterus.  Pulmonary/Chest: Effort normal.  Musculoskeletal:     Cervical back: He exhibits normal range of motion.  Neurological: He is alert and oriented to person, place, and time.  Skin: He is not diaphoretic.  Psychiatric: He has a normal mood and affect. His behavior is normal.    Ht 6' 0.5" (1.842 m)   BMI 34.24 kg/m  Wt Readings from Last 3 Encounters:  12/07/17 256 lb (116.1 kg)  06/30/17 253 lb 6 oz (114.9 kg)  12/07/16 251 lb 9.6 oz (114.1 kg)     There are no preventive care reminders to display for this patient.  There are no preventive care reminders to display for this patient.  Lab Results  Component Value Date   TSH 1.64 06/30/2017   Lab Results  Component Value Date   WBC 7.0 06/30/2017   HGB 16.2 06/30/2017    HCT 47.5 06/30/2017   MCV 86.6 06/30/2017   PLT 291.0 06/30/2017   Lab Results  Component Value Date   NA 139 11/11/2018   K 4.3 11/11/2018   CO2 24 11/11/2018   GLUCOSE 102 (H) 11/11/2018   BUN 19 11/11/2018   CREATININE 0.86 11/11/2018   BILITOT 0.9 11/11/2018   ALKPHOS 59 11/11/2018   AST 18 11/11/2018   ALT 32 11/11/2018   PROT 7.4 11/11/2018   ALBUMIN 4.5 11/11/2018   CALCIUM 9.3 11/11/2018   GFR 95.24 11/11/2018   Lab Results  Component Value Date   CHOL 199 11/11/2018   Lab Results  Component Value Date   HDL 28.90 (L) 11/11/2018   No results found for: [facility] Lab Results  Component Value Date   TRIG (H) 11/11/2018    614.0 Triglyceride is over 400; calculations on Lipids are invalid.   Lab Results  Component Value Date   CHOLHDL 7 11/11/2018   No results found for: HGBA1C    Assessment & Plan:   Problem List Items Addressed This Visit      Musculoskeletal and Integument   Cervical strain - Primary   Relevant Medications   meloxicam (MOBIC) 15 MG tablet   methocarbamol (ROBAXIN) 500 MG tablet   Other Relevant Orders   Ambulatory referral to Sports Medicine     Other   Elevated triglycerides with high cholesterol   Relevant Medications   fenofibrate 54 MG tablet   Other Relevant Orders   Comprehensive metabolic panel (Completed)   Lipid panel (Completed)      Meds ordered this encounter  Medications  . meloxicam (MOBIC) 15 MG tablet    Sig: Take 1 tablet (15 mg total) by mouth daily. For 2 weeks.    Dispense:  30 tablet    Refill:  0  . methocarbamol (ROBAXIN) 500 MG tablet    Sig: Take 1 tablet (500 mg total) by mouth 3 (three) times daily. For 2 weeks.    Dispense:  60 tablet    Refill:  1  . fenofibrate 54 MG tablet    Sig: Take 1 tablet (54 mg total) by mouth daily.    Dispense:  90 tablet    Refill:  2    Follow-up: Return in about 3 months (around 02/09/2019).    [name], MDVirtual Visit via Video  Note  I connected with [place] on 11/11/18 at 11:00 AM EDT by a video enabled telemedicine application and verified that I am speaking with the correct person using two identifiers.  I discussed the limitations of evaluation and management by telemedicine and the availability of in person appointments. The patient expressed understanding and agreed to proceed.  History of Present Illness:    Observations/Objective:   Assessment and Plan:   Follow Up Instructions:    I discussed the assessment and treatment plan with the patient. The patient was provided an opportunity to ask questions and all were answered. The patient agreed with the plan and demonstrated an understanding of the instructions.   The patient was advised to call back or seek an in-person evaluation if the symptoms worsen or if the condition fails to improve as anticipated.  I provided 15 minutes of non-face-to-face time during this encounter.   Patient agrees to sports medicine referral Johnathan Bailey mostly to evaluate his symptoms.  He will make a lab appointment and return fasting for repeat of his elevated triglycerides.

## 2018-11-10 NOTE — Telephone Encounter (Signed)
Called pt to schedule an appointment with Dr. Raeford Razor referral from Dr. Ethelene Hal. Pt didn't want to schedule an appointment he wanted to call back

## 2018-11-11 ENCOUNTER — Other Ambulatory Visit (INDEPENDENT_AMBULATORY_CARE_PROVIDER_SITE_OTHER): Payer: Commercial Managed Care - PPO

## 2018-11-11 DIAGNOSIS — E782 Mixed hyperlipidemia: Secondary | ICD-10-CM | POA: Diagnosis not present

## 2018-11-11 LAB — LIPID PANEL
Cholesterol: 199 mg/dL (ref 0–200)
HDL: 28.9 mg/dL — ABNORMAL LOW (ref >39.00)
Total CHOL/HDL Ratio: 7
Triglycerides: 614 mg/dL — ABNORMAL HIGH (ref 0.0–149.0)

## 2018-11-11 LAB — COMPREHENSIVE METABOLIC PANEL
ALT: 32 U/L (ref 0–53)
AST: 18 U/L (ref 0–37)
Albumin: 4.5 g/dL (ref 3.5–5.2)
Alkaline Phosphatase: 59 U/L (ref 39–117)
BUN: 19 mg/dL (ref 6–23)
CO2: 24 mEq/L (ref 19–32)
Calcium: 9.3 mg/dL (ref 8.4–10.5)
Chloride: 106 mEq/L (ref 96–112)
Creatinine, Ser: 0.86 mg/dL (ref 0.40–1.50)
GFR: 95.24 mL/min (ref >60.00)
Glucose, Bld: 102 mg/dL — ABNORMAL HIGH (ref 70–99)
Potassium: 4.3 mEq/L (ref 3.5–5.1)
Sodium: 139 mEq/L (ref 135–145)
Total Bilirubin: 0.9 mg/dL (ref 0.2–1.2)
Total Protein: 7.4 g/dL (ref 6.0–8.3)

## 2018-11-11 LAB — LDL CHOLESTEROL, DIRECT: Direct LDL: 78 mg/dL

## 2018-11-11 MED ORDER — FENOFIBRATE 54 MG PO TABS: 54.0000 mg | ORAL_TABLET | Freq: Every day | ORAL | 2 refills | Status: DC

## 2018-11-11 NOTE — Addendum Note (Signed)
Addended by: Jon Billings on: 11/11/2018 02:11 PM   Modules accepted: Orders, Level of Service

## 2019-10-18 ENCOUNTER — Other Ambulatory Visit: Payer: Self-pay

## 2019-10-19 ENCOUNTER — Encounter: Payer: Self-pay | Admitting: Family Medicine

## 2019-10-19 ENCOUNTER — Ambulatory Visit (INDEPENDENT_AMBULATORY_CARE_PROVIDER_SITE_OTHER): Payer: Commercial Managed Care - PPO | Admitting: Family Medicine

## 2019-10-19 VITALS — BP 132/76 | HR 74 | Temp 97.6°F | Ht 72.0 in | Wt 259.8 lb

## 2019-10-19 DIAGNOSIS — E6609 Other obesity due to excess calories: Secondary | ICD-10-CM | POA: Diagnosis not present

## 2019-10-19 DIAGNOSIS — E782 Mixed hyperlipidemia: Secondary | ICD-10-CM

## 2019-10-19 DIAGNOSIS — Z0000 Encounter for general adult medical examination without abnormal findings: Secondary | ICD-10-CM

## 2019-10-19 DIAGNOSIS — Z6835 Body mass index (BMI) 35.0-35.9, adult: Secondary | ICD-10-CM | POA: Diagnosis not present

## 2019-10-19 LAB — HEPATIC FUNCTION PANEL
ALT: 20 U/L (ref 0–53)
AST: 15 U/L (ref 0–37)
Albumin: 4.5 g/dL (ref 3.5–5.2)
Alkaline Phosphatase: 59 U/L (ref 39–117)
Bilirubin, Direct: 0.1 mg/dL (ref 0.0–0.3)
Total Bilirubin: 0.7 mg/dL (ref 0.2–1.2)
Total Protein: 7.2 g/dL (ref 6.0–8.3)

## 2019-10-19 LAB — URINALYSIS, ROUTINE W REFLEX MICROSCOPIC
Bilirubin Urine: NEGATIVE
Hgb urine dipstick: NEGATIVE
Ketones, ur: NEGATIVE
Leukocytes,Ua: NEGATIVE
Nitrite: NEGATIVE
Specific Gravity, Urine: 1.025 (ref 1.000–1.030)
Total Protein, Urine: NEGATIVE
Urine Glucose: NEGATIVE
Urobilinogen, UA: 0.2 (ref 0.0–1.0)
pH: 5.5 (ref 5.0–8.0)

## 2019-10-19 LAB — CBC
HCT: 43.4 % (ref 39.0–52.0)
Hemoglobin: 14.9 g/dL (ref 13.0–17.0)
MCHC: 34.4 g/dL (ref 30.0–36.0)
MCV: 86.6 fl (ref 78.0–100.0)
Platelets: 257 10*3/uL (ref 150.0–400.0)
RBC: 5.01 Mil/uL (ref 4.22–5.81)
RDW: 12.8 % (ref 11.5–15.5)
WBC: 6.8 10*3/uL (ref 4.0–10.5)

## 2019-10-19 LAB — LIPID PANEL
Cholesterol: 194 mg/dL (ref 0–200)
HDL: 32.1 mg/dL — ABNORMAL LOW (ref >39.00)
NonHDL: 161.92
Total CHOL/HDL Ratio: 6
Triglycerides: 345 mg/dL — ABNORMAL HIGH (ref 0.0–149.0)
VLDL: 69 mg/dL — ABNORMAL HIGH (ref 0.0–40.0)

## 2019-10-19 LAB — TSH: TSH: 1.8 u[IU]/mL (ref 0.35–4.50)

## 2019-10-19 LAB — BASIC METABOLIC PANEL
BUN: 22 mg/dL (ref 6–23)
CO2: 24 mEq/L (ref 19–32)
Calcium: 9.3 mg/dL (ref 8.4–10.5)
Chloride: 105 mEq/L (ref 96–112)
Creatinine, Ser: 0.76 mg/dL (ref 0.40–1.50)
GFR: 109.41 mL/min (ref >60.00)
Glucose, Bld: 101 mg/dL — ABNORMAL HIGH (ref 70–99)
Potassium: 4.3 mEq/L (ref 3.5–5.1)
Sodium: 136 mEq/L (ref 135–145)

## 2019-10-19 LAB — PSA: PSA: 0.65 ng/mL (ref 0.10–4.00)

## 2019-10-19 LAB — HEMOGLOBIN A1C: Hgb A1c MFr Bld: 5.5 % (ref 4.6–6.5)

## 2019-10-19 LAB — LDL CHOLESTEROL, DIRECT: Direct LDL: 110 mg/dL

## 2019-10-19 NOTE — Progress Notes (Signed)
Established Patient Office Visit  Subjective:  Patient ID: Johnathan Bailey, male    DOB: 05-10-1971  Age: 49 y.o. MRN: [id number]  CC:  Chief Complaint  Patient presents with  . Annual Exam    CPE, no concerns.     HPI Johnathan Bailey presents for a complete physical exam.  He is fasting this morning.  He would like a pleural check with the blood work including his prostate he tells me.  There is no prostate disease that runs to the family that he is aware of.  He has no issues with his urine flow.  Has been having some left shoulder pain.  He does work in Architect.  He does not drink alcohol during the week but does drink heavily on Saturday nights.  He is currently not exercising other than his job.  He is interested in starting back to the gym.  Received his first Covid vaccine a week or so ago.  Both of his parents are healthy as far as he knows.  There is a thought that his father might have diabetes.  History reviewed. No pertinent past medical history.  History reviewed. No pertinent surgical history.  History reviewed. No pertinent family history.  Social History   Socioeconomic History  . Marital status: Single    Spouse name: Not on file  . Number of children: Not on file  . Years of education: Not on file  . Highest education level: Not on file  Occupational History  . Not on file  Tobacco Use  . Smoking status: Never Smoker  . Smokeless tobacco: Never Used  Substance and Sexual Activity  . Alcohol use: Yes    Alcohol/week: 0.0 standard drinks  . Drug use: No  . Sexual activity: Not on file  Other Topics Concern  . Not on file  Social History Narrative  . Not on file   Social Determinants of Health   Financial Resource Strain:   . Difficulty of Paying Living Expenses:   Food Insecurity:   . Worried About Charity fundraiser in the Last Year:   . Arboriculturist in the Last Year:   Transportation Needs:   . Film/video editor  (Medical):   Marland Kitchen Lack of Transportation (Non-Medical):   Physical Activity:   . Days of Exercise per Week:   . Minutes of Exercise per Session:   Stress:   . Feeling of Stress :   Social Connections:   . Frequency of Communication with Friends and Family:   . Frequency of Social Gatherings with Friends and Family:   . Attends Religious Services:   . Active Member of Clubs or Organizations:   . Attends Archivist Meetings:   Marland Kitchen Marital Status:   Intimate Partner Violence:   . Fear of Current or Ex-Partner:   . Emotionally Abused:   Marland Kitchen Physically Abused:   . Sexually Abused:     Outpatient Medications Prior to Visit  Medication Sig Dispense Refill  . fenofibrate 54 MG tablet Take 1 tablet (54 mg total) by mouth daily. 90 tablet 2  . meloxicam (MOBIC) 15 MG tablet Take 1 tablet (15 mg total) by mouth daily. For 2 weeks. (Patient not taking: Reported on 10/19/2019) 30 tablet 0  . methocarbamol (ROBAXIN) 500 MG tablet Take 1 tablet (500 mg total) by mouth 3 (three) times daily. For 2 weeks. (Patient not taking: Reported on 10/19/2019) 60 tablet 1   No facility-administered  medications prior to visit.    No Known Allergies  ROS Review of Systems  Constitutional: Negative.   HENT: Negative.   Eyes: Negative for photophobia and visual disturbance.  Respiratory: Negative.   Cardiovascular: Negative.   Gastrointestinal: Negative.   Endocrine: Negative for polyphagia and polyuria.  Genitourinary: Negative for difficulty urinating, frequency and urgency.  Musculoskeletal: Negative for gait problem and joint swelling.  Skin: Negative for pallor.  Allergic/Immunologic: Negative for immunocompromised state.  Neurological: Negative for tremors and speech difficulty.  Hematological: Does not bruise/bleed easily.  Psychiatric/Behavioral: Negative.    Depression screen [facility] 2/9 10/19/2019 12/07/2016  Decreased Interest 0 0  Down, Depressed, Hopeless 0 0  PHQ - 2 Score 0 0        Objective:    Physical Exam  BP 132/76   Pulse 74   Temp 97.6 F (36.4 C) (Tympanic)   Ht 6' (1.829 m)   Wt 259 lb 12.8 oz (117.8 kg)   SpO2 95%   BMI 35.24 kg/m  Wt Readings from Last 3 Encounters:  10/19/19 259 lb 12.8 oz (117.8 kg)  12/07/17 256 lb (116.1 kg)  06/30/17 253 lb 6 oz (114.9 kg)     There are no preventive care reminders to display for this patient.  There are no preventive care reminders to display for this patient.  Lab Results  Component Value Date   TSH 1.64 06/30/2017   Lab Results  Component Value Date   WBC 7.0 06/30/2017   HGB 16.2 06/30/2017   HCT 47.5 06/30/2017   MCV 86.6 06/30/2017   PLT 291.0 06/30/2017   Lab Results  Component Value Date   NA 139 11/11/2018   K 4.3 11/11/2018   CO2 24 11/11/2018   GLUCOSE 102 (H) 11/11/2018   BUN 19 11/11/2018   CREATININE 0.86 11/11/2018   BILITOT 0.9 11/11/2018   ALKPHOS 59 11/11/2018   AST 18 11/11/2018   ALT 32 11/11/2018   PROT 7.4 11/11/2018   ALBUMIN 4.5 11/11/2018   CALCIUM 9.3 11/11/2018   GFR 95.24 11/11/2018   Lab Results  Component Value Date   CHOL 199 11/11/2018   Lab Results  Component Value Date   HDL 28.90 (L) 11/11/2018   No results found for: [facility] Lab Results  Component Value Date   TRIG (H) 11/11/2018    614.0 Triglyceride is over 400; calculations on Lipids are invalid.   Lab Results  Component Value Date   CHOLHDL 7 11/11/2018   No results found for: HGBA1C    Assessment & Plan:   Problem List Items Addressed This Visit      Other   Healthcare maintenance   Relevant Orders   Basic metabolic panel   CBC   Urinalysis, Routine w reflex microscopic   Hemoglobin A1c   PSA   Elevated triglycerides with high cholesterol - Primary   Relevant Orders   LDL cholesterol, direct   Hepatic function panel   Lipid panel   TSH   Class 2 obesity due to excess calories with body mass index (BMI) of 35.0 to 35.9 in adult      No orders of the defined  types were placed in this encounter.   Follow-up: Return in about 3 months (around 01/19/2020), or Try Weight Watchers!.   Patient is motivated to lose weight and I suggested that he try weight watchers.  Also expressed my concern about his elevated triglycerides that may need treating.  Explained that high triglycerides can affect  his liver and pancreas.  He was given information on hyper lipidemia, exercising to lose weight, health maintenance and disease prevention all in Spanish.  Follow-up in 3 months and we will also discuss binge drinking.  We did discuss starting a medicine if his triglycerides are still elevated.   [name], MD

## 2019-10-19 NOTE — Patient Instructions (Addendum)
Dislipidemia Dyslipidemia La dislipidemia es un desequilibrio de sustancias cerosas parecidas a la grasa (lpidos) en la sangre. El cuerpo necesita lpidos en pequeas cantidades. Con frecuencia, la dislipidemia implica un nivel alto de colesterol o triglicridos, que son tipos de lpidos. Las formas frecuentes de dislipidemia incluyen las siguientes:  Niveles elevados de colesterol LDL. El LDL es el tipo de colesterol que causa la acumulacin de depsitos de grasa (placas) en los vasos sanguneos que transportan la sangre fuera del corazn (arterias).  Niveles bajos de colesterol HDL. El HDL es el tipo de colesterol que brinda proteccin contra las enfermedades cardacas. Los niveles altos de HDL eliminan la acumulacin de LDL de las arterias.  Niveles altos de triglicridos. Los triglicridos son Ardelia Mems sustancia grasa presente en la sangre que se relaciona con la acumulacin de placa en las arterias. Cules son las causas? La dislipidemia primaria es causada por cambios (mutaciones) en los genes que se transmiten a travs de las familias (se heredan). Estas mutaciones causan varios tipos de dislipidemia. La dislipidemia secundaria es causada por elecciones de estilos de vida y enfermedades que ocasionan dislipidemia, tales como:  Seguir una dieta rica en grasa de origen animal.  No hacer suficiente ejercicio fsico.  Tener diabetes, enfermedad renal, enfermedad heptica o enfermedad tiroidea.  Beber grandes cantidades de alcohol.  Tomar ciertos medicamentos. Qu incrementa el riesgo? Tiene ms probabilidades de Armed forces training and education officer afeccin si es un hombre mayor o si es una mujer que ha pasado por la menopausia. Otros factores de riesgo son los siguientes:  Tener antecedentes familiares de dislipidemia.  Tomar determinados medicamentos, entre ellos, pldoras anticonceptivas, corticoesteroides, algunos diurticos y betabloqueantes.  Fumar cigarrillos.  Consumir una dieta rica en  grasas.  Tener ciertas afecciones mdicas, como diabetes, sndrome de ovario poliqustico (SOP), enfermedad renal, enfermedad heptica o hipotiroidismo.  No hacer ejercicio regularmente.  Tener sobrepeso o ser obeso con demasiada grasa en el abdomen. Cules son los signos o los sntomas? En la [employer], la dislipidemia no causa ningn sntoma. En los casos graves, los niveles muy altos de lpidos pueden causar:  Protuberancias de grasa debajo de la piel (xantomas).  Un anillo blanco o gris alrededor del centro negro (pupila) del ojo. Los niveles muy altos de triglicridos pueden causar inflamacin del pncreas (pancreatitis). Cmo se diagnostica? Su mdico puede diagnosticar dislipidemia basndose en un anlisis de sangre de rutina (anlisis de sangre en [place]). Como la [employer] de las personas no tienen sntomas de la afeccin, este anlisis de sangre (perfil de lpidos) se realiza en adultos mayores de 1 aos y se repite cada 5 aos. En este anlisis, se controla lo siguiente:  Colesterol total. Esto mide la cantidad total de colesterol en la sangre, que incluye el colesterol LDL, el colesterol HDL y los triglicridos. Un valor saludable es inferior a 200.  Colesterol LDL. El valor objetivo de colesterol LDL es diferente para cada persona, en funcin de los factores de riesgo individuales. Consulte al mdico cul debe ser el valor del colesterol LDL para usted.  Colesterol HDL. Un nivel de colesterol HDL de 60 o superior es lo mejor porque ayuda a Tour manager las enfermedades cardacas. Un valor inferior a 4 en los hombres o inferior a 1 en las mujeres aumenta el riesgo de enfermedades cardacas.  Triglicridos. Un valor saludable de triglicridos es inferior a 150. Si su perfil de lpidos es anormal, su mdico puede realizar otros anlisis de [place]. Cmo se trata? El tratamiento depende del tipo de dislipidemia  que usted tenga y sus otros factores de riesgo de  enfermedades cardacas o accidente cerebrovascular. Su mdico tendr un rango objetivo para sus niveles de lpidos en funcin de esta informacin. Para muchas personas, esta afeccin puede tratarse con cambios en el estilo de vida, tales como dieta y ejercicio. El mdico podra recomendarle que haga lo siguiente:  Hacer ejercicio con regularidad.  Realizar cambios en la dieta.  Si fuma, dejar de hacerlo. Si los cambios en la dieta y la actividad fsica no ayudan a Science writer sus objetivos, el mdico tambin puede recetarle medicamentos para disminuir los lpidos. El tipo de medicamento recetado con ms frecuencia disminuye el colesterol LDL (estatinas). Si tiene Education officer, environmental de triglicridos, su mdico puede recetarle otro tipo de frmaco (fibratos) o un suplemento de aceite de pescado con omega-3, o ambos. Siga estas indicaciones en su casa:  Comida y bebida  Siga las indicaciones del mdico o el nutricionista respecto de las restricciones para las comidas o las bebidas.  Siga una dieta saludable como se lo haya indicado el mdico. Esto puede ayudarle a Science writer y Theatre manager un peso saludable, reducir el colesterol LDL y aumentar el colesterol HDL. Puede incluir: ? Limitar sus caloras, si tiene sobrepeso. ? Comer ms frutas, verduras, cereales integrales, pescado y carnes magras. ? Limitar las grasas saturadas, las grasas trans y Freight forwarder.  Si bebe alcohol: ? Limite la cantidad que [place]. ? Est atento a la cantidad de alcohol que hay en las bebidas que toma. En los [place], una medida equivale a una botella de cerveza de 12oz (358ml), un vaso de vino de 5oz (175ml) o un vaso de una bebida alcohlica de alta graduacin de 1oz (48ml).  No beba alcohol si: ? Su mdico le indica no hacerlo. ? Est embarazada, puede estar embarazada o est tratando de quedar embarazada. Actividad  Haga ejercicio con regularidad. Siga un programa de ejercicio y entrenamiento de fuerza tal  como se lo haya indicado el mdico. Pregntele al mdico qu actividades son seguras para usted. El mdico puede recomendarle lo siguiente: ? 30 minutos de [place] de 4 a 6 das por semana. La caminata a paso ligero es un ejemplo de [place]. ? Entrenamiento de fuerza 2 [employer]. Indicaciones generales  No consuma ningn producto que contenga nicotina o tabaco, como cigarrillos, cigarrillos electrnicos y tabaco de Higher education careers adviser. Si necesita ayuda para dejar de fumar, consulte al mdico.  [employer] de venta libre y los recetados solamente como se lo haya indicado el mdico. Esto incluye los suplementos.  Concurra a todas las visitas de seguimiento como se lo haya indicado el mdico. Comunquese con un mdico si:  Usted: ? Tiene dificultad para cumplir con su plan de actividad fsica o dieta. ? Le cuesta dejar de fumar o controlar el consumo de alcohol. Resumen  Con frecuencia, la dislipidemia implica un nivel alto de colesterol o triglicridos, que son tipos de lpidos.  El tratamiento depende del tipo de dislipidemia que usted tenga y sus otros factores de riesgo de enfermedades cardacas o accidente cerebrovascular.  Para muchas personas, el tratamiento comienza con cambios en el estilo de vida, tales como dieta y [place].  Su mdico puede recetarle medicamentos para disminuir los lpidos. Esta informacin no tiene Marine scientist el consejo del mdico. Asegrese de hacerle al mdico cualquier pregunta que tenga. Document Revised: 03/16/2018 Document Reviewed: 03/16/2018 Elsevier Patient Education  2020 [place] la salud en [place]  Maintenance, Male Adoptar un estilo de vida saludable y recibir atencin preventiva son importantes para promover la salud y Musician. Consulte al mdico sobre:  El esquema adecuado para hacerse pruebas y exmenes peridicos.  Cosas que puede hacer por su cuenta para prevenir  enfermedades y [place] sano. Qu debo saber sobre la dieta, el peso y el ejercicio? Consuma una dieta saludable   Consuma una dieta que incluya muchas verduras, frutas, productos lcteos con bajo contenido de [place] y Advertising account planner.  No consuma muchos alimentos ricos en grasas slidas, azcares agregados o sodio. Mantenga un peso saludable El ndice de masa muscular [facility]) es una medida que puede utilizarse para identificar posibles problemas de [place]. Proporciona una estimacin de la grasa corporal basndose en el peso y la altura. Su mdico puede ayudarle a Radiation protection practitioner [place] y a Scientist, forensic o Theatre manager un peso saludable. Haga ejercicio con regularidad Haga ejercicio con regularidad. Esta es una de las prcticas ms importantes que puede hacer por su salud. La mayora de los adultos deben seguir estas pautas:  Optometrist, al menos, 135minutos de actividad fsica por semana. El ejercicio debe aumentar la frecuencia cardaca y Nature conservation officer transpirar (ejercicio de intensidad moderada).  Hacer ejercicios de fortalecimiento por lo [employer] por semana. Agregue esto a su plan de ejercicio de intensidad moderada.  Pasar menos tiempo sentados. Incluso la actividad fsica ligera puede ser beneficiosa. Controle sus niveles de colesterol y lpidos en la sangre Comience a realizarse anlisis de lpidos y Research officer, trade union en la sangre a los 20aos y luego reptalos cada 5aos. Es posible que Automotive engineer los niveles de colesterol con mayor frecuencia si:  Sus niveles de lpidos y colesterol son altos.  Es mayor de 40aos.  Presenta un alto riesgo de padecer enfermedades cardacas. Qu debo saber sobre las pruebas de deteccin del cncer? Muchos tipos de cncer pueden detectarse de manera temprana y, a menudo, pueden prevenirse. Segn su historia clnica y sus antecedentes familiares, es posible que deba realizarse pruebas de deteccin del cncer en diferentes edades. Esto puede incluir pruebas de  deteccin de lo siguiente:  Surveyor, minerals.  Cncer de prstata.  Cncer de piel.  Cncer de pulmn. Qu debo saber sobre la enfermedad cardaca, la diabetes y la hipertensin arterial? Presin arterial y enfermedad cardaca  La hipertensin arterial causa enfermedades cardacas y [place] el riesgo de accidente cerebrovascular. Es ms probable que esto se manifieste en las personas que tienen lecturas de presin arterial alta, tienen ascendencia africana o tienen sobrepeso.  Hable con el mdico sobre sus valores de presin arterial deseados.  Hgase controlar la presin arterial: ? Cada 3 a 5 aos si tiene entre 18 y 36 aos. ? Todos los aos si es mayor de [place].  Si tiene entre 12 y 59 aos y es fumador o Insurance account manager, pregntele al mdico si debe realizarse una prueba de deteccin de aneurisma artico abdominal (AAA) por nica vez. Diabetes Realcese exmenes de deteccin de la diabetes con regularidad. Este anlisis revisa el nivel de azcar en la sangre en [place]. Hgase las pruebas de deteccin:  Cada tresaos despus de los 42aos de edad si tiene un peso normal y un bajo riesgo de padecer diabetes.  Con ms frecuencia y a partir de [place] edad inferior si tiene sobrepeso o un alto riesgo de padecer diabetes. Qu debo saber sobre la prevencin de infecciones? Hepatitis B Si tiene un riesgo ms alto de contraer hepatitis B, debe someterse a Barista de deteccin de [place]  virus. Hable con el mdico para averiguar si tiene riesgo de contraer la infeccin por hepatitis B. Hepatitis C Se recomienda un anlisis de [place] para:  Todos los que nacieron entre 1945 y [phone].  Todas las personas que tengan un riesgo de haber contrado hepatitis C. Enfermedades de transmisin sexual (ETS)  Debe realizarse pruebas de deteccin de ITS todos los aos, incluidas la gonorrea y la clamidia, si: ? Es sexualmente activo y es menor de 24aos. ? Es mayor de 24aos, y Investment banker, operational  informa que corre riesgo de tener este tipo de infecciones. ? La actividad sexual ha cambiado desde que le hicieron la ltima prueba de deteccin y tiene un riesgo mayor de Best boy clamidia o Radio broadcast assistant. Pregntele al mdico si usted tiene riesgo.  Pregntele al mdico si usted tiene un alto riesgo de Museum/gallery curator VIH. El mdico tambin puede recomendarle un medicamento recetado para ayudar a evitar la infeccin por el VIH. Si elige tomar medicamentos para prevenir el VIH, primero debe [employer] de deteccin del VIH. Luego debe hacerse anlisis cada 87meses mientras est tomando los medicamentos. Siga estas instrucciones en su casa: Estilo de vida  No consuma ningn producto que contenga nicotina o tabaco, como cigarrillos, cigarrillos electrnicos y tabaco de Higher education careers adviser. Si necesita ayuda para dejar de fumar, consulte al mdico.  No consuma drogas.  No comparta agujas.  Solicite ayuda a su mdico si necesita apoyo o informacin para abandonar las drogas. Consumo de alcohol  No beba alcohol si el mdico se lo prohbe.  Si bebe alcohol: ? Limite la cantidad que consume de 0 a 2 medidas por da. ? Est atento a la cantidad de alcohol que hay en las bebidas que toma. En los [place], una medida equivale a una botella de cerveza de 12oz (381ml), un vaso de vino de 5oz (146ml) o un vaso de una bebida alcohlica de alta graduacin de 1oz (33ml). Instrucciones generales  Realcese los estudios de rutina de la salud, dentales y de Public librarian.  [place].  Infrmele a su mdico si: ? Se siente deprimido con frecuencia. ? Alguna vez ha sido vctima de [place] o no se siente seguro en su casa. Resumen  Adoptar un estilo de vida saludable y recibir atencin preventiva son importantes para promover la salud y Musician.  Siga las instrucciones del mdico acerca de una dieta saludable, el ejercicio y la realizacin de pruebas o exmenes para Theatre manager.  Siga las instrucciones del mdico con respecto al control del colesterol y la presin arterial. Esta informacin no tiene Marine scientist el consejo del mdico. Asegrese de hacerle al mdico cualquier pregunta que tenga. Document Revised: 08/03/2018 Document Reviewed: 08/03/2018 Elsevier Patient Education  2020 [place] en los adultos Obesity, Adult La obesidad es una afeccin que implica tener demasiada grasa corporal total. Tener sobrepeso u obesidad significa que el peso es mayor que lo que se considera saludable para el Tour manager. La obesidad se determina por Abner Greenspan llamada [facility]. [place] (ndice de masa corporal) es la estimacin de la grasa corporal y se calcula a partir de la altura y [place]. Si un adulto tiene un [facility] de 30 o superior se considera obeso. La obesidad puede conducir a algunos de los siguientes problemas de salud y [place] graves:  Accidente cerebrovascular.  Arteriopata coronaria (EAC).  Diabetes tipo 2.  Algunos tipos de cncer, incluido el cncer de colon, mama, tero y vescula.  Artrosis.  Presin arterial alta (hipertensin arterial).  Colesterol alto.  Apnea del sueo.  Clculos en la vescula.  Problemas de esterilidad. Cules son las causas? Las causas ms frecuentes de esta afeccin [employer] siguientes:  Consumir diariamente alimentos con altos niveles de caloras, azcar y [place].  Nacer con genes que pueden hacerlo ms propenso a ser obeso.  Tener una afeccin que causa obesidad, por ejemplo: ? Hipotiroidismo. ? Sndrome del ovario poliqustico (SOP). ? Trastorno alimentario compulsivo. ? Sndrome de [place].  Tomar ciertos medicamentos, como esteroides, antidepresivos y [place].  No ser fsicamente activo (estilo de vida sedentario).  No dormir lo suficiente.  Beber grandes cantidades de bebidas endulzadas con azcar, como refrescos. Qu incrementa el riesgo? Los  siguientes factores pueden hacer que sea ms propenso a contraer esta afeccin:  Tener antecedentes familiares de obesidad.  Ser una mujer de origen afroamericano.  Ser un hombre de origen hispano.  Vivir en un rea con acceso limitado a las siguientes posibilidades: ? Parques, centros recreativos o veredas. ? Alimentos saludables, como se venden en tiendas de comestibles y mercados de [place]. Cules son los signos o los sntomas? El principal signo de esta afeccin es tener demasiada grasa corporal. Cmo se diagnostica? Esta afeccin se diagnostica en funcin de lo siguiente:  Su IMC. Si usted es un adulto y su [place] es de 30 o ms, se considera que es obeso.  La circunferencia de la cintura. Es Ardelia Mems medicin alrededor de Science writer.  El grosor del pliegue cutneo. El mdico puede pellizcar suavemente un pliegue de la piel y [place]. Es posible que le hagan otros estudios para ver si hay afecciones subyacentes. Cmo se trata? El tratamiento de esta afeccin frecuentemente incluye cambiar el estilo de vida. El tratamiento puede incluir algunos o todos los siguientes elementos:  Cambios en la dieta. Esto puede incluir el desarrollo de un plan de alimentacin saludable.  Realizar actividad fsica con regularidad. Puede incluir una actividad que hace que el corazn lata ms rpido (ejercicio [place]) y Chiropodist de Teacher, early years/pre. Trabajar con el mdico para disear un programa de ejercicios que sea adecuado para usted.  Medicamentos para ayudarle a bajar de peso si no puede perder 1 libra (450g) por semana despus de 6 semanas de comer saludablemente y de hacer ms actividad fsica.  Tratar las afecciones que causan la obesidad (afecciones preexistentes).  [place]. Las opciones quirrgicas pueden incluir bandas gstricas y bypass gstrico. Se puede realizar una ciruga si: ? Otros tratamientos no mejoraron su afeccin. ? Tiene un IMC de 40 o superior. ? Tiene problemas de salud  potencialmente mortales relacionados con la obesidad. Siga estas instrucciones en su casa: Comida y bebida   Siga las instrucciones del mdico respecto de lo que puede comer o beber. El mdico puede indicarle que haga lo siguiente: ? Limitar las comidas rpidas, los dulces y las colaciones procesadas. ? Elegir opciones con bajo contenido de [place], como leche descremada en lugar de [place] entera. ? Consumir 5o ms porciones de frutas o verduras por da. ? Comer en casa con ms frecuencia. Esto le da ms control sobre lo que come. ? Cuando coma afuera, elija alimentos saludables. ? Aprenda a leer las etiquetas de los alimentos. Esto le ayudar a entender cunta comida se considera una porcin. ? Aprenda cul es el tamao de una porcin saludable. ? Tenga a mano colaciones con bajo contenido de grasas. ? Limite las bebidas azucaradas, como refrescos, jugo de frutas, t helado endulzado y [place] saborizada.  Beba suficiente agua para mantener la orina de color amarillo plido.  No siga una dieta de [place]. Las dietas de moda pueden ser poco saludables e incluso peligrosas. Actividad fsica  Realice ejercicio con regularidad como se lo haya indicado el mdico. ? La [employer] de los adultos deben hacer hasta 118minutos de ejercicio de intensidad moderada cada semana. ? Consulte al mdico qu tipo de ejercicios es seguro para usted y con qu frecuencia debe ejercitarse.  Precaliente y elongue adecuadamente antes de hacer actividad fsica.  Reljese y elongue despus de hacer actividad fsica.  Descanse entre los perodos de [place]. Estilo de vida  Trabaje con el mdico y un nutricionista para Health visitor una meta de prdida de peso que sea saludable y razonable para usted.  Limite el tiempo que pasa frente a una pantalla.  Busque formas de recompensarse que no incluyan alimentos.  No beba alcohol si: ? Su mdico le indica no hacerlo. ? Est embarazada, puede estar embarazada o est  tratando de quedar embarazada.  Si bebe alcohol: ? Limite la cantidad que bebe a lo siguiente:  De 0 a 1 medida por da para las mujeres.  De 0 a 2 medidas por da para los hombres. ? Est atento a la cantidad de alcohol que hay en las bebidas que toma. En los [place], una medida equivale a una botella de cerveza de 12oz (330ml), un vaso de vino de 5oz (181ml) o un vaso de una bebida alcohlica de alta graduacin de 1oz (70ml). Instrucciones generales  Registre la prdida de peso en un diario para Optometrist un seguimiento de los alimentos que consume y cunto se Psychologist, forensic.  Tome los medicamentos de venta libre y los recetados solamente como se lo haya indicado el [place] vitaminas y suplementos solamente como se lo haya indicado el mdico.  Considere la posibilidad de Chief Financial Officer en un grupo de apoyo. El mdico podra recomendarle un grupo de apoyo.  Concurra a todas las visitas de seguimiento como se lo haya indicado el mdico. Esto es importante. Comunquese con un mdico si:  No puede alcanzar su objetivo de prdida de peso despus de 6 semanas de cambios en la dieta y en el estilo de vida. Solicite ayuda inmediatamente si tiene:  Dificultad para respirar.  Pensamientos o conductas suicidas. Resumen  La obesidad es una afeccin que implica tener demasiada grasa corporal total.  Tener sobrepeso u obesidad significa que el peso es mayor que lo que se considera saludable para el tamao corporal.  Genevive Bi con el mdico y un nutricionista para establecer una meta de prdida de peso que sea saludable y razonable para usted.  Realice ejercicio con regularidad como se lo haya indicado el mdico. Consulte al mdico qu tipo de ejercicios es seguro para usted y con qu frecuencia debe ejercitarse. Esta informacin no tiene Marine scientist el consejo del mdico. Asegrese de hacerle al mdico cualquier pregunta que tenga. Document Revised: 04/07/2018 Document  Reviewed: 04/07/2018 Elsevier Patient Education  2020 [place] preventivos en los hombres de 66 a 55 aos de edad Preventive Care 45-15 Years Old, Male [place] cuidados preventivos hacen referencia a las opciones en cuanto al estilo de vida y a las visitas al mdico, las cuales pueden promover la salud y Musician. Esto puede comprender lo siguiente:  Un examen fsico anual. Esto tambin se conoce como control de bienestar anual.  Exmenes dentales y oculares de [place] regular.  [place].  Estudios para Engineer, building services.  Opciones saludables de estilo de vida, como seguir una dieta saludable, hacer ejercicio regularmente, no usar drogas ni productos que contengan nicotina y tabaco, y limitar el consumo de alcohol. Qu puedo esperar para mi visita de cuidado preventivo? Examen fsico El mdico controlar lo siguiente:  IT consultant y [place]. Estos datos se pueden usar para calcular el ndice de masa corporal ([place]), una medicin que indica si usted tiene un peso saludable.  Frecuencia cardaca y presin arterial.  Piel para detectar manchas anormales. Asesoramiento El mdico puede hacerle preguntas sobre lo siguiente:  Consumo de tabaco, alcohol y drogas.  Bienestar emocional.  Bienestar en el hogar y sus relaciones personales.  Actividad sexual.  Hbitos de alimentacin.  Trabajo y [place] laboral. Sander Nephew vacunas necesito?  [place] antigripal  Se recomienda aplicarse esta vacuna todos los [place]. Vacuna contra el ttanos, la difteria y la tos ferina (Tdap)  Es posible que tenga que aplicarse un refuerzo contra el ttanos y la difteria (DT) cada 10aos. Vacuna contra la varicela  Es posible que tenga que aplicrsela si an no la recibi. Vacuna contra el herpes zster (culebrilla)  Es posible que la necesite despus de los 72 aos de [place]. Vacuna contra el sarampin, la rubola y las paperas ([place])  Es posible que necesite aplicarse al menos una  dosis de la vacuna SRP si naci despus de [phone]. Tambin es posible que necesite una segunda dosis. Vacuna antineumoccica conjugada (PCV13)  Puede necesitar esta vacuna si tiene determinadas enfermedades y no se vacun anteriormente. [place] antineumoccica de polisacridos (PPSV23)  Quizs tenga que aplicarse una o dos dosis si fuma o si tiene determinadas afecciones. Edward Jolly antimeningoccica conjugada (MenACWY)  Puede necesitar esta vacuna si tiene determinadas afecciones. Vacuna contra la hepatitis A  Es posible que necesite esta vacuna si tiene ciertas afecciones o si viaja o trabaja en lugares en los que podra estar expuesto a la hepatitis A. Vacuna contra la hepatitis B  Es posible que necesite esta vacuna si tiene ciertas afecciones o si viaja o trabaja en lugares en los que podra estar expuesto a la hepatitis B. Vacuna antihaemophilus influenzae tipo B (Hib)  Es posible que necesite esta vacuna si tiene algunos factores de [place]. Vacuna contra el virus del papiloma humano (VPH)  Si el mdico se lo recomienda, Research scientist (physical sciences) tres dosis a lo largo de 6 meses. Puede recibir las vacunas en forma de dosis individuales o en forma de dos o ms vacunas juntas en la misma inyeccin (vacunas combinadas). Hable con su mdico [employer] y beneficios de las vacunas combinadas. Qu pruebas necesito? Anlisis de [employer] de lpidos y colesterol. Estos se pueden verificar cada 5 aos o, con ms frecuencia, si usted tiene ms de 18 aos de edad.  Anlisis de hepatitisC.  Anlisis de hepatitisB. Pruebas de deteccin  Pruebas de deteccin de cncer de pulmn. Es posible que se le realice esta prueba de deteccin a partir de los 11 aos de edad, si ha fumado durante 30 aos un paquete diario y sigue fumando o dej el hbito en algn momento en los ltimos 15 aos.  Examen de deteccin del cncer de prstata. Las recomendaciones variarn segn sus antecedentes familiares y  Hydrologist.  Pruebas de Programme researcher, broadcasting/film/video de Surveyor, minerals. Todos los adultos a partir de los 15 aos de edad y [place] 36 aos de edad deben hacerse esta prueba de deteccin. El mdico puede recomendarle las pruebas de deteccin a Proofreader de los 54 aos de edad  si corre Magazine features editor. Le realizarn pruebas cada 1 a 10 aos, segn los Bruno y el tipo de prueba de Programme researcher, broadcasting/film/video.  Pruebas de deteccin de la diabetes. Esto se Set designer un control del azcar en la sangre (glucosa) despus de no haber comido durante un periodo de tiempo (ayuno). Es posible que se le realice esta prueba cada 1 a 3 aos.  Anlisis de enfermedades de transmisin sexual (ETS). Siga estas instrucciones en su casa: Comida y bebida  Siga una dieta que incluya frutas y verduras frescas, cereales integrales, protenas magras y productos lcteos descremados.  Tome los suplementos vitamnicos y [employer] se lo haya indicado el mdico.  No beba alcohol si el mdico se lo prohbe.  Si bebe alcohol: ? Limite la cantidad que consume de 0 a 2 medidas por da. ? Est atento a la cantidad de alcohol que hay en las bebidas que toma. En los [place], una medida equivale a una botella de cerveza de 12oz (316ml), un vaso de vino de 5oz (156ml) o un vaso de una bebida alcohlica de alta graduacin de 1oz (10ml). Estilo de [employer] y las encas a diario.  Mantngase activo. Haga al menos 50minutos de ejercicio 5o ms [employer].  No consuma ningn producto que contenga nicotina o tabaco, como cigarrillos, cigarrillos electrnicos y tabaco de Higher education careers adviser. Si necesita ayuda para dejar de fumar, consulte al mdico.  Si es sexualmente activo, practique sexo seguro. Use un condn u otra forma de proteccin para prevenir las ITS (infecciones de transmisin sexual).  Hable con el mdico acerca de tomar una dosis baja de aspirina o estatina todos los das a partir de los 22 aos. Cundo  volver?  Acuda al mdico una vez al ao para una visita de control.  Pregntele al mdico con qu frecuencia debe realizarse un control de la vista y los dientes.  Mantenga su esquema de vacunacin al da. Esta informacin no tiene Marine scientist el consejo del mdico. Asegrese de hacerle al mdico cualquier pregunta que tenga. Document Revised: 08/05/2018 Document Reviewed: 08/05/2018 Elsevier Patient Education  [place] para Sports coach de peso Exercising to [employer] El ejercicio es la actividad fsica estructurada y repetitiva que se realiza para mejorar el [place] fsico y Technical sales engineer. Hacer ejercicio de forma regular es importante para todos. Es especialmente importante si tiene sobrepeso. El sobrepeso aumenta el riesgo de tener enfermedad cardaca, accidente cerebrovascular, diabetes, presin arterial alta y varios tipos de cncer. Reducir la ingesta de caloras y hacer ejercicio pueden ayudarlo a bajar de [place]. El ejercicio por lo general se clasifica como de intensidad moderada o vigorosa. Para bajar de peso, la [employer] de las personas debe hacer una determinada cantidad de ejercicio de intensidad moderada o vigorosa cada semana. Ejercicio de intensidad moderada  El ejercicio de intensidad moderada es cualquier actividad que lo haga moverse lo suficiente como para quemar al menos tres veces ms energa (caloras) que si estuviera sentado. Algunos ejemplos de ejercicio de intensidad moderada son:  Caminar una milla (1,6 kilmetros) en 15 minutos.  Hacer trabajos de jardinera livianos.  Andar en bicicleta a un ritmo fcil de aguantar. La [employer] de las personas debe hacer al menos 150 minutos (2 horas y 30 minutos) por semana de ejercicio de intensidad moderada para Theatre manager su Engineer, site. Ejercicio de intensidad vigorosa El ejercicio de intensidad vigorosa es cualquier actividad que lo haga moverse lo suficiente como para Public librarian  al menos seis veces ms  caloras que si estuviera sentado. Al hacer ejercicio a esta intensidad, su nivel de esfuerzo debera ser lo suficientemente alto como para no permitirle Programmer, systems. Algunos ejemplos de ejercicio de intensidad vigorosa son:  Optometrist.  Practicar un deporte de equipo, como ftbol americano, baloncesto y ftbol.  Saltar la cuerda. La [employer] de las personas debe hacer al menos 75 minutos (1 hora y 15 minutos) por semana de ejercicio de intensidad vigorosa para mantener su Engineer, site. Cmo puede afectarme el ejercicio? Cuando hace suficiente ejercicio como para quemar ms caloras que las que consume, pierde [place]. Tambin reduce la grasa corporal y aumenta la masa muscular. Cuanto ms msculo tenga, mayor cantidad de Nurse, children's. El ejercicio tambin:  Mejora el estado de nimo.  Reduce el estrs y las tensiones.  Mejora el estado fsico general, la flexibilidad y la resistencia.  Aumenta la fuerza sea. La cantidad de ejercicio que necesita realizar para bajar de peso depende de:  Su edad.  El tipo de ejercicio.  Cualquier afeccin de [employer].  Su capacidad fsica general. Pregntele al mdico cunto ejercicio debe realizar y qu tipos de actividades son seguras para usted. Qu medidas puedo tomar para bajar de peso? Nutricin   [employer] dieta como se lo haya indicado el mdico o especialista en alimentacin y nutricin (nutricionista). Esto puede incluir lo siguiente: ? Consumir menos caloras. ? Consumir ms protenas. ? Consumir menos grasas no saludables. ? Seguir una dieta que incluya frutas y verduras frescas, cereales integrales, productos lcteos semidescremados y Advertising account planner. ? Evite los alimentos con grasa, sal y azcar agregadas.  Beba gran cantidad de agua mientras hace ejercicio para evitar la deshidratacin o los golpes de Freight forwarder. Actividad  Elija una actividad que disfrute y establezca objetivos realistas. El mdico  puede ayudarlo a Paediatric nurse un plan de ejercicio que funcione para usted.  Haga ejercicio a una intensidad moderada o vigorosa la [employer] de la [place]. ? La intensidad de la actividad fsica puede variar de Ardelia Mems persona a Theatre manager. Puede saber qu tan intensa una rutina de ejercicios es para usted al Sales promotion account executive atencin a su respiracin y latidos cardacos. La [employer] de las personas notar que su respiracin y latidos cardacos se aceleran al Optometrist ejercicio de mayor intensidad.  Haga entrenamiento de resistencia dos veces por semana, como: ? Flexiones de [employer]. ? Abdominales. ? Levantamiento de pesas. ? Uso de bandas elsticas de resistencia.  Hacer ejercicio en perodos cortos de [employer] ser tan til como los perodos largos y estructurados de ejercicio. Si tiene dificultad para encontrar tiempo para Engineer, site, trate de incluir el ejercicio en su rutina diaria. ? Levntese, estrese y camine cada 25minutos a lo largo del Training and development officer. ? Vaya a caminar durante su hora de almuerzo. ? Estacione el auto lejos de su lugar de destino. ? Si [place] transporte pblico, bjese una parada antes y camine el resto del camino. ? Pngase de pie y camine cada vez que hable por telfono. ? Utilice la Writer del ascensor o la Civil engineer, contracting.  Use ropa cmoda y calzado con buen soporte.  No haga ejercicio en exceso que pudiera hacer que se lastime, se sienta mareado o tenga dificultad para respirar. Dnde buscar ms informacin  Departamento de Salud y Servicios Humanos de los [place] Un[place] (U.S. Department of Health and [employer]): [email or web address].at  Centros para Building surveyor y la Prevencin de Probation officer for Disease  Control and Prevention, CDC): http://www.wolf.info/ Comunquese con un mdico:  Antes de comenzar un nuevo programa de ejercicios.  Si tiene preguntas o inquietudes acerca de su peso.  Si tiene un problema mdico que Producer, television/film/video. Obtenga ayuda de  inmediato si presenta alguno de los siguientes problemas al hacer ejercicio:  Lesiones.  Mareos.  Dificultad para respirar o falta de aire que no desaparecen al dejar de hacer ejercicio.  Dolor en el pecho.  Latidos cardacos rpidos. Resumen  El sobrepeso aumenta el riesgo de tener enfermedad cardaca, accidente cerebrovascular, diabetes, presin arterial alta y varios tipos de cncer.  Para perder peso debe quemar ms caloras que las que consume.  Reducir la cantidad de caloras que consume, adems de hacer ejercicio de intensidad moderada o vigorosa todas las [place], [place] a Administrator, Civil Service. Esta informacin no tiene Marine scientist el consejo del mdico. Asegrese de hacerle al mdico cualquier pregunta que tenga. Document Revised: 09/10/2017 Document Reviewed: 09/10/2017 Elsevier Patient Education  [place].

## 2020-02-01 ENCOUNTER — Ambulatory Visit: Payer: Commercial Managed Care - PPO | Admitting: Family Medicine

## 2020-02-12 ENCOUNTER — Other Ambulatory Visit: Payer: Self-pay

## 2020-02-13 ENCOUNTER — Encounter: Payer: Self-pay | Admitting: Family Medicine

## 2020-02-13 ENCOUNTER — Ambulatory Visit: Payer: Commercial Managed Care - PPO | Admitting: Family Medicine

## 2020-02-13 VITALS — BP 118/74 | HR 92 | Temp 96.3°F | Ht 72.0 in | Wt 264.0 lb

## 2020-02-13 DIAGNOSIS — E782 Mixed hyperlipidemia: Secondary | ICD-10-CM

## 2020-02-13 LAB — LIPID PANEL
Cholesterol: 198 mg/dL (ref 0–200)
HDL: 35.7 mg/dL — ABNORMAL LOW (ref >39.00)
NonHDL: 162.4
Total CHOL/HDL Ratio: 6
Triglycerides: 395 mg/dL — ABNORMAL HIGH (ref 0.0–149.0)
VLDL: 79 mg/dL — ABNORMAL HIGH (ref 0.0–40.0)

## 2020-02-13 LAB — LDL CHOLESTEROL, DIRECT: Direct LDL: 109 mg/dL

## 2020-02-13 NOTE — Progress Notes (Addendum)
Established Patient Office Visit  Subjective:  Patient ID: Johnathan Bailey, male    DOB: July 26, 1971  Age: 49 y.o. MRN: [id number]  CC:  Chief Complaint  Patient presents with  . Follow-up    3 month follow up, no concerns.     HPI Johnathan Bailey presents for follow-up of elevated triglycerides.  He has been taking 2 g of fish oil daily.  He has adjusted his diet and has been working out on a regular basis.  He has decreased his drinking on the weekends.  Feeling great.  Nonfasting today but tells me there was no fat and what he ate this morning.  History reviewed. No pertinent past medical history.  History reviewed. No pertinent surgical history.  History reviewed. No pertinent family history.  Social History   Socioeconomic History  . Marital status: Single    Spouse name: Not on file  . Number of children: Not on file  . Years of education: Not on file  . Highest education level: Not on file  Occupational History  . Not on file  Tobacco Use  . Smoking status: Never Smoker  . Smokeless tobacco: Never Used  Substance and Sexual Activity  . Alcohol use: Yes    Alcohol/week: 0.0 standard drinks  . Drug use: No  . Sexual activity: Not on file  Other Topics Concern  . Not on file  Social History Narrative  . Not on file   Social Determinants of Health   Financial Resource Strain:   . Difficulty of Paying Living Expenses:   Food Insecurity:   . Worried About Charity fundraiser in the Last Year:   . Arboriculturist in the Last Year:   Transportation Needs:   . Film/video editor (Medical):   Marland Kitchen Lack of Transportation (Non-Medical):   Physical Activity:   . Days of Exercise per Week:   . Minutes of Exercise per Session:   Stress:   . Feeling of Stress :   Social Connections:   . Frequency of Communication with Friends and Family:   . Frequency of Social Gatherings with Friends and Family:   . Attends Religious Services:   . Active Member of  Clubs or Organizations:   . Attends Archivist Meetings:   Marland Kitchen Marital Status:   Intimate Partner Violence:   . Fear of Current or Ex-Partner:   . Emotionally Abused:   Marland Kitchen Physically Abused:   . Sexually Abused:     Outpatient Medications Prior to Visit  Medication Sig Dispense Refill  . omega-3 acid ethyl esters (LOVAZA) 1 g capsule Take by mouth daily.    . fenofibrate 54 MG tablet Take 1 tablet (54 mg total) by mouth daily. 90 tablet 2   No facility-administered medications prior to visit.    No Known Allergies  ROS Review of Systems  Constitutional: Negative.   Respiratory: Negative.   Cardiovascular: Negative.   Gastrointestinal: Negative.       Objective:    Physical Exam Vitals and nursing note reviewed.  Constitutional:      General: He is not in acute distress.    Appearance: Normal appearance. He is not ill-appearing, toxic-appearing or diaphoretic.  HENT:     Head: Normocephalic and atraumatic.     Right Ear: External ear normal.     Left Ear: External ear normal.  Eyes:     General: No scleral icterus.       Right  eye: No discharge.        Left eye: No discharge.     Conjunctiva/sclera: Conjunctivae normal.  Pulmonary:     Effort: Pulmonary effort is normal.  Neurological:     Mental Status: He is alert and oriented to person, place, and time.  Psychiatric:        Mood and Affect: Mood normal.        Behavior: Behavior normal.     BP 118/74   Pulse 92   Temp (!) 96.3 F (35.7 C) (Tympanic)   Ht 6' (1.829 m)   Wt 264 lb (119.7 kg)   SpO2 94%   BMI 35.80 kg/m  Wt Readings from Last 3 Encounters:  02/13/20 264 lb (119.7 kg)  10/19/19 259 lb 12.8 oz (117.8 kg)  12/07/17 256 lb (116.1 kg)     Health Maintenance Due  Topic Date Due  . Hepatitis C Screening  Never done  . COVID-19 Vaccine (1) Never done    There are no preventive care reminders to display for this patient.  Lab Results  Component Value Date   TSH 1.80  10/19/2019   Lab Results  Component Value Date   WBC 6.8 10/19/2019   HGB 14.9 10/19/2019   HCT 43.4 10/19/2019   MCV 86.6 10/19/2019   PLT 257.0 10/19/2019   Lab Results  Component Value Date   NA 136 10/19/2019   K 4.3 10/19/2019   CO2 24 10/19/2019   GLUCOSE 101 (H) 10/19/2019   BUN 22 10/19/2019   CREATININE 0.76 10/19/2019   BILITOT 0.7 10/19/2019   ALKPHOS 59 10/19/2019   AST 15 10/19/2019   ALT 20 10/19/2019   PROT 7.2 10/19/2019   ALBUMIN 4.5 10/19/2019   CALCIUM 9.3 10/19/2019   GFR 109.41 10/19/2019   Lab Results  Component Value Date   CHOL 198 02/13/2020   Lab Results  Component Value Date   HDL 35.70 (L) 02/13/2020   No results found for: [facility] Lab Results  Component Value Date   TRIG 395.0 (H) 02/13/2020   Lab Results  Component Value Date   CHOLHDL 6 02/13/2020   Lab Results  Component Value Date   HGBA1C 5.5 10/19/2019      Assessment & Plan:   Problem List Items Addressed This Visit      Other   Elevated triglycerides with high cholesterol - Primary   Relevant Medications   omega-3 acid ethyl esters (LOVAZA) 1 g capsule   fenofibrate (TRICOR) 48 MG tablet   Other Relevant Orders   Lipid panel (Completed)   LDL cholesterol, direct (Completed)      Meds ordered this encounter  Medications  . fenofibrate (TRICOR) 48 MG tablet    Sig: Take 1 tablet (48 mg total) by mouth daily.    Dispense:  90 tablet    Refill:  1    Follow-up: Return FOLLOW UP DEPENDS ON LAB RESULTS.Marland Kitchen  Hopefully the [place] has lowered his triglycerides to a more reasonable level.  [name], MD   7/21 addendum: Katherene Ponto are even higher will start fenofibrate.

## 2020-02-13 NOTE — Patient Instructions (Signed)
Prevencin del colesterol alto Preventing High Cholesterol El colesterol es una sustancia cerosa parecida a la grasa que el organismo necesita en pequeas cantidades. El hgado fabrica todo el colesterol que el cuerpo necesita. Tener el colesterol alto (hipercolesterolemia) aumenta el riesgo de sufrir enfermedades cardacas y accidentes cerebrovasculares. El colesterol extra (exceso de colesterol) proviene de los alimentos que come, por ejemplo grasas de origen animal (grasas saturadas) de la carne y de algunos productos lcteos. El colesterol alto con frecuencia puede prevenirse con cambios en la dieta y en el estilo de vida. Si ya tiene Orthoptist, puede controlarlo haciendo cambios en la dieta y en el estilo de vida, adems de con medicamentos. Qu cambios en la alimentacin se pueden hacer?  Coma menos grasas saturadas. Los alimentos que contienen grasas saturadas incluyen las carnes rojas y algunos productos lcteos.  Evite las carnes procesadas, como el tocino, los fiambres y embutidos.  Evite las grasas trans, que se encuentran en la margarina y en algunos productos horneados.  Evite alimentos y bebidas que tengan azcares agregados.  Consuma ms frutas, verduras y cereales integrales.  Elija fuentes saludables de protenas, como el pescado, la carne de ave y los frutos secos.  Elija fuentes saludables de grasas, por ejemplo: ? Frutos secos. ? Aceites vegetales, en particular el aceite de oliva. ? Pescados que contengan grasas saludables (cidos grasos omega-3), como la caballa o el salmn. Qu cambios en el estilo de vida se pueden realizar?   Baje de peso si es necesario. Bajar entre 5 y 10lb (2,3 a 4,5kg) puede ayudar a prevenir o Academic librarian alto y a Psychiatrist de padecer diabetes y presin arterial alta (hipertensin). Pdale al mdico que le recomiende una dieta y un plan de ejercicios para bajar de peso de forma segura.  Ejerctese lo suficiente.  Debe realizar al menos 147minutos de ejercicios de intensidad moderada todas las semanas. ? Patent examiner en sesiones cortas de ejercicios, varias veces al da, o puede realizar sesiones ms largas, pero menos veces por semana. Por ejemplo, puede realizar una caminata enrgica o andar en bicicleta durante 80minutos, 3veces al da, durante 5das a la semana.  No fume. Si necesita ayuda para dejar de fumar, consulte al mdico.  Limite el consumo de bebidas alcohlicas. Si bebe alcohol, limite el consumo a no ms de 12medida por da si es mujer y no est [place], y 73medidas por da si es hombre. Una medida equivale a 12onzas de cerveza, 5onzas de vino o 1onzas de bebidas alcohlicas de alta graduacin. Por qu son importantes estos cambios?  Si tiene Orthoptist, se pueden acumular depsitos de esta sustancia (placa) en las paredes de los vasos sanguneos. La placa hace que las arterias se vuelvan ms estrechas y rgidas, lo que puede limitar u obstruir la circulacin sangunea y Actor la formacin de cogulos de [place]. Esto aumenta en gran medida el riesgo de infarto de miocardio y de accidente cerebrovascular. Hacer cambios en la dieta y en el estilo de vida puede ayudar a reducir el riesgo de sufrir estas afecciones potencialmente mortales. Qu puedo hacer para reducir mis riesgos?  Controle los factores de riesgo del colesterol alto. Hable con el mdico acerca de todos los factores de riesgo y cmo reducir Catering manager.  Controle otras afecciones que pueda tener, por ejemplo diabetes o presin arterial alta (hipertensin).  Contrlese el colesterol a intervalos regulares.  Concurra a todas las visitas de control como se lo haya indicado el mdico.  Esto es importante. Cmo se trata? Adems de los cambios en la dieta y en el estilo de vida, el mdico puede recomendarle que tome ciertos medicamentos para reducir el colesterol, por ejemplo, medicamentos que reducen la  cantidad de colesterol producida por el hgado. Es posible que necesite tomar medicamentos si:  No logra reducir lo suficiente el colesterol con cambios en la dieta y en el estilo de vida.  Tiene colesterol alto y presenta otros factores de riesgo de sufrir enfermedades cardacas o accidentes cerebrovasculares. Tome los medicamentos de venta libre y los recetados solamente como se lo haya indicado el mdico. Dnde encontrar ms informacin  Asociacin Estadounidense de Catering manager (Musician, Medical illustrator): [email or web address]  [place], Education officer, museum y Music therapist (Water engineer, Lung, and [place]): [email or web address] Resumen  El colesterol alto aumenta el riesgo de sufrir enfermedades cardacas y accidentes cerebrovasculares. Si mantiene el colesterol bajo, puede reducir el riesgo de tener estas afecciones.  Los [employer] dieta y en el estilo de vida son los pasos ms importantes para prevenir Advertising account planner.  Consulte al mdico para controlar los factores de riesgo y hgase anlisis de sangre con regularidad. Esta informacin no tiene Marine scientist el consejo del mdico. Asegrese de hacerle al mdico cualquier pregunta que tenga. Document Revised: 10/21/2016 Document Reviewed: 07/28/2015 Elsevier Patient Education  [place] Dyslipidemia La dislipidemia es un desequilibrio de sustancias cerosas parecidas a la grasa (lpidos) en la [place]. El cuerpo necesita lpidos en pequeas cantidades. Con frecuencia, la dislipidemia implica un nivel alto de colesterol o triglicridos, que son tipos de lpidos. Las formas frecuentes de dislipidemia incluyen las siguientes:  Niveles elevados de colesterol LDL. El LDL es el tipo de colesterol que causa la acumulacin de depsitos de grasa (placas) en  los vasos sanguneos que transportan la sangre fuera del corazn (arterias).  Niveles bajos de colesterol HDL. El HDL es el tipo de colesterol que brinda proteccin contra las enfermedades cardacas. Los niveles altos de HDL eliminan la acumulacin de LDL de las arterias.  Niveles altos de triglicridos. Los triglicridos son Ardelia Mems sustancia grasa presente en la sangre que se relaciona con la acumulacin de placa en las arterias. Cules son las causas? La dislipidemia primaria es causada por cambios (mutaciones) en los genes que se transmiten a travs de las familias (se heredan). Estas mutaciones causan varios tipos de dislipidemia. La dislipidemia secundaria es causada por elecciones de estilos de vida y enfermedades que ocasionan dislipidemia, tales como:  Seguir una dieta rica en grasa de origen animal.  No hacer suficiente ejercicio fsico.  Tener diabetes, enfermedad renal, enfermedad heptica o enfermedad tiroidea.  Beber grandes cantidades de alcohol.  Tomar ciertos medicamentos. Qu incrementa el riesgo? Tiene ms probabilidades de Armed forces training and education officer afeccin si es un hombre mayor o si es una mujer que ha pasado por la menopausia. Otros factores de riesgo son los siguientes:  Tener antecedentes familiares de dislipidemia.  Tomar determinados medicamentos, entre ellos, pldoras anticonceptivas, corticoesteroides, algunos diurticos y betabloqueantes.  Fumar cigarrillos.  Consumir una dieta rica en grasas.  Tener ciertas afecciones mdicas, como diabetes, sndrome de ovario poliqustico (SOP), enfermedad renal, enfermedad heptica o hipotiroidismo.  No hacer ejercicio regularmente.  Tener sobrepeso o ser obeso con demasiada grasa en el abdomen. Cules son los signos o los sntomas? En la [employer], la dislipidemia no causa ningn sntoma. En los casos graves, los niveles muy altos de lpidos pueden causar:  Protuberancias de grasa debajo  de la piel  (xantomas).  Un anillo blanco o gris alrededor del centro negro (pupila) del ojo. Los niveles muy altos de triglicridos pueden causar inflamacin del pncreas (pancreatitis). Cmo se diagnostica? Su mdico puede diagnosticar dislipidemia basndose en un anlisis de sangre de rutina (anlisis de sangre en [place]). Como la [employer] de las personas no tienen sntomas de la afeccin, este anlisis de sangre (perfil de lpidos) se realiza en adultos mayores de 75 aos y se repite cada 5 aos. En este anlisis, se controla lo siguiente:  Colesterol total. Esto mide la cantidad total de colesterol en la sangre, que incluye el colesterol LDL, el colesterol HDL y los triglicridos. Un valor saludable es inferior a 200.  Colesterol LDL. El valor objetivo de colesterol LDL es diferente para cada persona, en funcin de los factores de riesgo individuales. Consulte al mdico cul debe ser el valor del colesterol LDL para usted.  Colesterol HDL. Un nivel de colesterol HDL de 60 o superior es lo mejor porque ayuda a Tour manager las enfermedades cardacas. Un valor inferior a 40 en los hombres o inferior a 3 en las mujeres aumenta el riesgo de enfermedades cardacas.  Triglicridos. Un valor saludable de triglicridos es inferior a 150. Si su perfil de lpidos es anormal, su mdico puede realizar otros anlisis de [place]. Cmo se trata? El tratamiento depende del tipo de dislipidemia que usted tenga y sus otros factores de riesgo de enfermedades cardacas o accidente cerebrovascular. Su mdico tendr un rango objetivo para sus niveles de lpidos en funcin de esta informacin. Para muchas personas, esta afeccin puede tratarse con cambios en el estilo de vida, tales como dieta y ejercicio. El mdico podra recomendarle que haga lo siguiente:  Hacer ejercicio con regularidad.  Realizar cambios en la dieta.  Si fuma, dejar de hacerlo. Si los cambios en la dieta y la actividad fsica no ayudan a Science writer  sus objetivos, el mdico tambin puede recetarle medicamentos para disminuir los lpidos. El tipo de medicamento recetado con ms frecuencia disminuye el colesterol LDL (estatinas). Si tiene Education officer, environmental de triglicridos, su mdico puede recetarle otro tipo de frmaco (fibratos) o un suplemento de aceite de pescado con omega-3, o ambos. Siga estas indicaciones en su casa:  Comida y bebida  Siga las indicaciones del mdico o el nutricionista respecto de las restricciones para las comidas o las bebidas.  Siga una dieta saludable como se lo haya indicado el mdico. Esto puede ayudarle a Science writer y Theatre manager un peso saludable, reducir el colesterol LDL y aumentar el colesterol HDL. Puede incluir: ? Limitar sus caloras, si tiene sobrepeso. ? Comer ms frutas, verduras, cereales integrales, pescado y carnes magras. ? Limitar las grasas saturadas, las grasas trans y Freight forwarder.  Si bebe alcohol: ? Limite la cantidad que [place]. ? Est atento a la cantidad de alcohol que hay en las bebidas que toma. En los [place], una medida equivale a una botella de cerveza de 12oz (331ml), un vaso de vino de 5oz (150ml) o un vaso de una bebida alcohlica de alta graduacin de 1oz (56ml).  No beba alcohol si: ? Su mdico le indica no hacerlo. ? Est embarazada, puede estar embarazada o est tratando de quedar embarazada. Actividad  Haga ejercicio con regularidad. Siga un programa de ejercicio y entrenamiento de fuerza tal como se lo haya indicado el mdico. Pregntele al mdico qu actividades son seguras para usted. El mdico puede recomendarle lo siguiente: ? 30 minutos de [place] de 4  a 6 das por semana. La caminata a paso ligero es un ejemplo de [place]. ? Entrenamiento de fuerza 2 [employer]. Indicaciones generales  No consuma ningn producto que contenga nicotina o tabaco, como cigarrillos, cigarrillos electrnicos y tabaco de Higher education careers adviser. Si necesita ayuda para  dejar de fumar, consulte al mdico.  [employer] de venta libre y los recetados solamente como se lo haya indicado el mdico. Esto incluye los suplementos.  Concurra a todas las visitas de seguimiento como se lo haya indicado el mdico. Comunquese con un mdico si:  Usted: ? Tiene dificultad para cumplir con su plan de actividad fsica o dieta. ? Le cuesta dejar de fumar o controlar el consumo de alcohol. Resumen  Con frecuencia, la dislipidemia implica un nivel alto de colesterol o triglicridos, que son tipos de lpidos.  El tratamiento depende del tipo de dislipidemia que usted tenga y sus otros factores de riesgo de enfermedades cardacas o accidente cerebrovascular.  Para muchas personas, el tratamiento comienza con cambios en el estilo de vida, tales como dieta y [place].  Su mdico puede recetarle medicamentos para disminuir los lpidos. Esta informacin no tiene Marine scientist el consejo del mdico. Asegrese de hacerle al mdico cualquier pregunta que tenga. Document Revised: 03/16/2018 Document Reviewed: 03/16/2018 Elsevier Patient Education  2020 [employer].

## 2020-02-14 MED ORDER — FENOFIBRATE 48 MG PO TABS
48.0000 mg | ORAL_TABLET | Freq: Every day | ORAL | 1 refills | Status: DC
Start: 2020-02-14 — End: 2021-03-14

## 2020-02-14 NOTE — Addendum Note (Signed)
Addended by: Abelino Derrick A on: 02/14/2020 07:30 AM   Modules accepted: Orders, Level of Service

## 2021-03-13 ENCOUNTER — Encounter: Payer: Self-pay | Admitting: Family Medicine

## 2021-03-13 ENCOUNTER — Ambulatory Visit: Payer: Commercial Managed Care - PPO | Admitting: Family Medicine

## 2021-03-13 ENCOUNTER — Other Ambulatory Visit: Payer: Self-pay | Admitting: Family Medicine

## 2021-03-13 ENCOUNTER — Other Ambulatory Visit: Payer: Self-pay

## 2021-03-13 VITALS — BP 124/74 | HR 83 | Temp 98.4°F | Ht 72.0 in | Wt 269.4 lb

## 2021-03-13 DIAGNOSIS — K8020 Calculus of gallbladder without cholecystitis without obstruction: Secondary | ICD-10-CM

## 2021-03-13 DIAGNOSIS — Z125 Encounter for screening for malignant neoplasm of prostate: Secondary | ICD-10-CM | POA: Diagnosis not present

## 2021-03-13 DIAGNOSIS — Z0001 Encounter for general adult medical examination with abnormal findings: Secondary | ICD-10-CM

## 2021-03-13 DIAGNOSIS — E782 Mixed hyperlipidemia: Secondary | ICD-10-CM

## 2021-03-13 DIAGNOSIS — R1011 Right upper quadrant pain: Secondary | ICD-10-CM | POA: Diagnosis not present

## 2021-03-13 DIAGNOSIS — Z0000 Encounter for general adult medical examination without abnormal findings: Secondary | ICD-10-CM

## 2021-03-13 LAB — PSA: PSA: 0.78 ng/mL (ref 0.10–4.00)

## 2021-03-13 LAB — HEPATIC FUNCTION PANEL
ALT: 46 U/L (ref 0–53)
AST: 29 U/L (ref 0–37)
Albumin: 4.6 g/dL (ref 3.5–5.2)
Alkaline Phosphatase: 63 U/L (ref 39–117)
Bilirubin, Direct: 0.2 mg/dL (ref 0.0–0.3)
Total Bilirubin: 1.3 mg/dL — ABNORMAL HIGH (ref 0.2–1.2)
Total Protein: 7.6 g/dL (ref 6.0–8.3)

## 2021-03-13 LAB — URINALYSIS, ROUTINE W REFLEX MICROSCOPIC
Hgb urine dipstick: NEGATIVE
Ketones, ur: NEGATIVE
Leukocytes,Ua: NEGATIVE
Nitrite: NEGATIVE
RBC / HPF: NONE SEEN (ref 0–?)
Specific Gravity, Urine: 1.025 (ref 1.000–1.030)
Total Protein, Urine: NEGATIVE
Urine Glucose: NEGATIVE
Urobilinogen, UA: 1 (ref 0.0–1.0)
pH: 6 (ref 5.0–8.0)

## 2021-03-13 LAB — CBC
HCT: 45.8 % (ref 39.0–52.0)
Hemoglobin: 15.6 g/dL (ref 13.0–17.0)
MCHC: 34 g/dL (ref 30.0–36.0)
MCV: 86.1 fl (ref 78.0–100.0)
Platelets: 273 10*3/uL (ref 150.0–400.0)
RBC: 5.32 Mil/uL (ref 4.22–5.81)
RDW: 12.4 % (ref 11.5–15.5)
WBC: 6.6 10*3/uL (ref 4.0–10.5)

## 2021-03-13 LAB — BASIC METABOLIC PANEL
BUN: 12 mg/dL (ref 6–23)
CO2: 25 mEq/L (ref 19–32)
Calcium: 9.6 mg/dL (ref 8.4–10.5)
Chloride: 101 mEq/L (ref 96–112)
Creatinine, Ser: 0.83 mg/dL (ref 0.40–1.50)
GFR: 102.75 mL/min (ref >60.00)
Glucose, Bld: 88 mg/dL (ref 70–99)
Potassium: 4.4 mEq/L (ref 3.5–5.1)
Sodium: 138 mEq/L (ref 135–145)

## 2021-03-13 LAB — LIPID PANEL
Cholesterol: 199 mg/dL (ref 0–200)
HDL: 31.8 mg/dL — ABNORMAL LOW (ref >39.00)
Total CHOL/HDL Ratio: 6
Triglycerides: 636 mg/dL — ABNORMAL HIGH (ref 0.0–149.0)

## 2021-03-13 LAB — LDL CHOLESTEROL, DIRECT: Direct LDL: 69 mg/dL

## 2021-03-13 LAB — HEMOGLOBIN A1C: Hgb A1c MFr Bld: 5.8 % (ref 4.6–6.5)

## 2021-03-13 NOTE — Progress Notes (Addendum)
Established Patient Office Visit  Subjective:  Patient ID: Johnathan Bailey, male    DOB: 01-28-1971  Age: 50 y.o. MRN: [id number]  CC:  Chief Complaint  Patient presents with   Pain    Right side pain x 2-3 weeks.     HPI Johnathan Bailey presents for for follow-up of a 2 to 3-week history of an intermittent right upper quadrant discomfort.  Pain has been low-level and it has not stopped him from his normal routine.  He denies nausea or vomiting or burning up into the chest.  Denies constipation, blood in the stool or change in color of his stools.  Urination has been normal without blood or difficulty.  He is no longer taking Tricor for his elevated triglycerides.  He is taking fish oil.  He does not smoke or use illicit drugs.  He does have several beers typically on Saturday night.  History reviewed. No pertinent past medical history.  History reviewed. No pertinent surgical history.  History reviewed. No pertinent family history.  Social History   Socioeconomic History   Marital status: Single    Spouse name: Not on file   Number of children: Not on file   Years of education: Not on file   Highest education level: Not on file  Occupational History   Not on file  Tobacco Use   Smoking status: Never   Smokeless tobacco: Never  Substance and Sexual Activity   Alcohol use: Yes    Alcohol/week: 0.0 standard drinks   Drug use: No   Sexual activity: Not on file  Other Topics Concern   Not on file  Social History Narrative   Not on file   Social Determinants of Health   Financial Resource Strain: Not on file  Food Insecurity: Not on file  Transportation Needs: Not on file  Physical Activity: Not on file  Stress: Not on file  Social Connections: Not on file  Intimate Partner Violence: Not on file    Outpatient Medications Prior to Visit  Medication Sig Dispense Refill   omega-3 acid ethyl esters (LOVAZA) 1 g capsule Take by mouth daily.     fenofibrate  (TRICOR) 48 MG tablet Take 1 tablet (48 mg total) by mouth daily. (Patient not taking: Reported on 03/13/2021) 90 tablet 1   No facility-administered medications prior to visit.    No Known Allergies  ROS Review of Systems  Constitutional:  Negative for chills, diaphoresis, fatigue, fever and unexpected weight change.  HENT: Negative.    Eyes:  Negative for photophobia and visual disturbance.  Respiratory:  Negative for chest tightness and shortness of breath.   Cardiovascular:  Negative for chest pain and palpitations.  Gastrointestinal:  Positive for abdominal pain. Negative for anal bleeding, blood in stool, constipation, diarrhea, nausea and vomiting.  Endocrine: Negative for polyphagia.  Genitourinary:  Negative for decreased urine volume, difficulty urinating, frequency, hematuria and urgency.  Musculoskeletal:  Positive for arthralgias (denies rt shoulder pain.). Negative for gait problem and joint swelling.  Skin:  Negative for pallor and rash.  Neurological:  Negative for speech difficulty and weakness.  Hematological:  Does not bruise/bleed easily.  Psychiatric/Behavioral: Negative.       Objective:    Physical Exam Vitals and nursing note reviewed.  Constitutional:      General: He is not in acute distress.    Appearance: He is not ill-appearing, toxic-appearing or diaphoretic.  HENT:     Head: Normocephalic and atraumatic.  Right Ear: Tympanic membrane, ear canal and external ear normal.     Left Ear: Tympanic membrane, ear canal and external ear normal.     Mouth/Throat:     Mouth: Mucous membranes are moist.     Pharynx: Oropharynx is clear. No oropharyngeal exudate or posterior oropharyngeal erythema.  Eyes:     General:        Right eye: No discharge.        Left eye: No discharge.     Extraocular Movements: Extraocular movements intact.     Conjunctiva/sclera: Conjunctivae normal.     Pupils: Pupils are equal, round, and reactive to light.  Neck:      Vascular: No carotid bruit.  Cardiovascular:     Rate and Rhythm: Normal rate and regular rhythm.  Pulmonary:     Effort: Pulmonary effort is normal.     Breath sounds: Normal breath sounds.  Abdominal:     General: Bowel sounds are normal. There is no distension.     Palpations: There is no mass.     Tenderness: There is no abdominal tenderness. There is no guarding or rebound.     Hernia: No hernia is present.  Musculoskeletal:     Cervical back: No rigidity or tenderness.     Right lower leg: No edema.     Left lower leg: No edema.  Lymphadenopathy:     Cervical: No cervical adenopathy.  Skin:    General: Skin is warm and dry.     Findings: No rash.  Neurological:     Mental Status: He is alert and oriented to person, place, and time.  Psychiatric:        Mood and Affect: Mood normal.        Behavior: Behavior normal.    BP 124/74 (BP Location: Right Arm, Patient Position: Sitting, Cuff Size: Large)   Pulse 83   Temp 98.4 F (36.9 C) (Oral)   Ht 6' (1.829 m)   Wt 269 lb 6.4 oz (122.2 kg)   SpO2 95%   BMI 36.54 kg/m  Wt Readings from Last 3 Encounters:  03/13/21 269 lb 6.4 oz (122.2 kg)  02/13/20 264 lb (119.7 kg)  10/19/19 259 lb 12.8 oz (117.8 kg)     Health Maintenance Due  Topic Date Due   Hepatitis C Screening  Never done   COLONOSCOPY (Pts 45-68yr Insurance coverage will need to be confirmed)  Never done   COVID-19 Vaccine (3 - Booster for Pfizer series) 03/31/2020   TETANUS/TDAP  07/27/2020   INFLUENZA VACCINE  02/24/2021    There are no preventive care reminders to display for this patient.  Lab Results  Component Value Date   TSH 1.80 10/19/2019   Lab Results  Component Value Date   WBC 6.6 03/13/2021   HGB 15.6 03/13/2021   HCT 45.8 03/13/2021   MCV 86.1 03/13/2021   PLT 273.0 03/13/2021   Lab Results  Component Value Date   NA 138 03/13/2021   K 4.4 03/13/2021   CO2 25 03/13/2021   GLUCOSE 88 03/13/2021   BUN 12 03/13/2021    CREATININE 0.83 03/13/2021   BILITOT 1.3 (H) 03/13/2021   ALKPHOS 63 03/13/2021   AST 29 03/13/2021   ALT 46 03/13/2021   PROT 7.6 03/13/2021   ALBUMIN 4.6 03/13/2021   CALCIUM 9.6 03/13/2021   GFR 102.75 03/13/2021   Lab Results  Component Value Date   CHOL 199 03/13/2021   Lab Results  Component Value  Date   HDL 31.80 (L) 03/13/2021   No results found for: [facility] Lab Results  Component Value Date   TRIG (H) 03/13/2021    636.0 Triglyceride is over 400; calculations on Lipids are invalid.   Lab Results  Component Value Date   CHOLHDL 6 03/13/2021   Lab Results  Component Value Date   HGBA1C 5.8 03/13/2021      Assessment & Plan:   Problem List Items Addressed This Visit       Digestive   Calculus of gallbladder without cholecystitis without obstruction     Other   Healthcare maintenance - Primary   Relevant Orders   Basic metabolic panel (Completed)   CBC (Completed)   Hemoglobin A1c (Completed)   PSA (Completed)   Urinalysis, Routine w reflex microscopic (Completed)   Ambulatory referral to Gastroenterology   Elevated triglycerides with high cholesterol   Relevant Medications   fenofibrate (TRICOR) 145 MG tablet   Other Relevant Orders   Lipid panel (Completed)   US Abdomen Complete (Completed)   LDL cholesterol, direct (Completed)   Right upper quadrant abdominal pain   Relevant Orders   Basic metabolic panel (Completed)   CBC (Completed)   Hepatic function panel (Completed)   Urinalysis, Routine w reflex microscopic (Completed)   US Abdomen Complete (Completed)    Meds ordered this encounter  Medications   fenofibrate (TRICOR) 145 MG tablet    Sig: Take 1 tablet (145 mg total) by mouth daily.    Dispense:  90 tablet    Refill:  1     Follow-up: Return in about 2 weeks (around 03/27/2021), or if symptoms worsen or fail to improve.  Given information on abdominal pain.  Agrees to go for abdominal ultrasound.  Asked him to consume no more  than 2 alcoholic beverages in a given setting.  Encouraged him to continue his weight loss efforts and improve eating habits.  Given information on health maintenance and disease prevention.  [name], MD

## 2021-03-14 MED ORDER — FENOFIBRATE 145 MG PO TABS: 145.0000 mg | ORAL_TABLET | Freq: Every day | ORAL | 1 refills | Status: DC

## 2021-03-14 NOTE — Addendum Note (Signed)
Addended by: Jon Billings on: 03/14/2021 07:52 AM   Modules accepted: Orders

## 2021-03-14 NOTE — Progress Notes (Signed)
Triglycerides are dangerously high. Have increased Tricor dosage. Please fu as directed.

## 2021-03-17 ENCOUNTER — Ambulatory Visit (HOSPITAL_BASED_OUTPATIENT_CLINIC_OR_DEPARTMENT_OTHER)
Admission: RE | Admit: 2021-03-17 | Discharge: 2021-03-17 | Disposition: A | Discharge status: Home / Self Care | Payer: Commercial Managed Care - PPO | Source: Ambulatory Visit | Attending: Family Medicine | Admitting: Family Medicine

## 2021-03-17 DIAGNOSIS — E782 Mixed hyperlipidemia: Secondary | ICD-10-CM | POA: Diagnosis not present

## 2021-03-17 DIAGNOSIS — R1011 Right upper quadrant pain: Secondary | ICD-10-CM | POA: Diagnosis present

## 2021-03-19 ENCOUNTER — Telehealth: Payer: Self-pay | Admitting: Family Medicine

## 2021-03-19 NOTE — Telephone Encounter (Signed)
Pt came by the office requesting someone to go over lab work from 8/18 with him. He said he was not great with MYChart and would like to speak to the nurse.  Best CB: [id number]

## 2021-03-19 NOTE — Telephone Encounter (Signed)
Spoke with patient regarding labs.

## 2021-03-20 DIAGNOSIS — K8020 Calculus of gallbladder without cholecystitis without obstruction: Secondary | ICD-10-CM | POA: Insufficient documentation

## 2021-04-02 ENCOUNTER — Other Ambulatory Visit: Payer: Self-pay

## 2021-04-03 ENCOUNTER — Ambulatory Visit: Payer: Commercial Managed Care - PPO | Admitting: Family Medicine

## 2021-04-03 ENCOUNTER — Encounter: Payer: Self-pay | Admitting: Family Medicine

## 2021-04-03 VITALS — BP 132/74 | HR 70 | Temp 97.1°F | Ht 72.0 in | Wt 263.8 lb

## 2021-04-03 DIAGNOSIS — K760 Fatty (change of) liver, not elsewhere classified: Secondary | ICD-10-CM | POA: Diagnosis not present

## 2021-04-03 DIAGNOSIS — N2889 Other specified disorders of kidney and ureter: Secondary | ICD-10-CM | POA: Diagnosis not present

## 2021-04-03 DIAGNOSIS — K8020 Calculus of gallbladder without cholecystitis without obstruction: Secondary | ICD-10-CM | POA: Diagnosis not present

## 2021-04-03 NOTE — Progress Notes (Signed)
Established Patient Office Visit  Subjective:  Patient ID: Johnathan Bailey, male    DOB: 11-12-1970  Age: 50 y.o. MRN: [id number]  CC:  Chief Complaint  Patient presents with   Follow-up    Follow up on right side  per patient he seems to be feeling a lot better. Would like to go over U/S results.     HPI [place] presents for further discussion of his ultrasound report.  Abdominal pain is resolved.  He has stopped drinking.  No past medical history on file.  No past surgical history on file.  No family history on file.  Social History   Socioeconomic History   Marital status: Single    Spouse name: Not on file   Number of children: Not on file   Years of education: Not on file   Highest education level: Not on file  Occupational History   Not on file  Tobacco Use   Smoking status: Never   Smokeless tobacco: Never  Substance and Sexual Activity   Alcohol use: Yes    Alcohol/week: 0.0 standard drinks   Drug use: No   Sexual activity: Not on file  Other Topics Concern   Not on file  Social History Narrative   Not on file   Social Determinants of Health   Financial Resource Strain: Not on file  Food Insecurity: Not on file  Transportation Needs: Not on file  Physical Activity: Not on file  Stress: Not on file  Social Connections: Not on file  Intimate Partner Violence: Not on file    Outpatient Medications Prior to Visit  Medication Sig Dispense Refill   fenofibrate (TRICOR) 145 MG tablet Take 1 tablet (145 mg total) by mouth daily. 90 tablet 1   omega-3 acid ethyl esters (LOVAZA) 1 g capsule Take by mouth daily.     No facility-administered medications prior to visit.    No Known Allergies  ROS Review of Systems  Constitutional: Negative.   Respiratory: Negative.    Cardiovascular: Negative.   Gastrointestinal: Negative.   Genitourinary: Negative.  Negative for frequency and hematuria.  Musculoskeletal: Negative.    Psychiatric/Behavioral: Negative.       Objective:    Physical Exam Vitals and nursing note reviewed.  Constitutional:      General: He is not in acute distress.    Appearance: Normal appearance. He is obese. He is not ill-appearing, toxic-appearing or diaphoretic.  HENT:     Head: Normocephalic and atraumatic.  Eyes:     General: No scleral icterus.       Right eye: No discharge.        Left eye: No discharge.     Conjunctiva/sclera: Conjunctivae normal.  Pulmonary:     Effort: Pulmonary effort is normal.  Neurological:     Mental Status: He is alert and oriented to person, place, and time.  Psychiatric:        Mood and Affect: Mood normal.        Behavior: Behavior normal.  Thank you okay  BP 132/74 (BP Location: Right Arm, Patient Position: Sitting, Cuff Size: Large)   Pulse 70   Temp (!) 97.1 F (36.2 C) (Temporal)   Ht 6' (1.829 m)   Wt 263 lb 12.8 oz (119.7 kg)   SpO2 96%   BMI 35.78 kg/m  Wt Readings from Last 3 Encounters:  04/03/21 263 lb 12.8 oz (119.7 kg)  03/13/21 269 lb 6.4 oz (122.2 kg)  02/13/20 264 lb (119.7 kg)     Health Maintenance Due  Topic Date Due   Hepatitis C Screening  Never done   COLONOSCOPY (Pts 45-44yr Insurance coverage will need to be confirmed)  Never done   TETANUS/TDAP  07/27/2020    There are no preventive care reminders to display for this patient.  Lab Results  Component Value Date   TSH 1.80 10/19/2019   Lab Results  Component Value Date   WBC 6.6 03/13/2021   HGB 15.6 03/13/2021   HCT 45.8 03/13/2021   MCV 86.1 03/13/2021   PLT 273.0 03/13/2021   Lab Results  Component Value Date   NA 138 03/13/2021   K 4.4 03/13/2021   CO2 25 03/13/2021   GLUCOSE 88 03/13/2021   BUN 12 03/13/2021   CREATININE 0.83 03/13/2021   BILITOT 1.3 (H) 03/13/2021   ALKPHOS 63 03/13/2021   AST 29 03/13/2021   ALT 46 03/13/2021   PROT 7.6 03/13/2021   ALBUMIN 4.6 03/13/2021   CALCIUM 9.6 03/13/2021   GFR 102.75 03/13/2021    Lab Results  Component Value Date   CHOL 199 03/13/2021   Lab Results  Component Value Date   HDL 31.80 (L) 03/13/2021   No results found for: [facility]  Component Value Date   TRIG (H) 03/13/2021    636.0 Triglyceride is over 400; calculations on Lipids are invalid.   Lab Results  Component Value Date   CHOLHDL 6 03/13/2021   Lab Results  Component Value Date   HGBA1C 5.8 03/13/2021      Assessment & Plan:   Problem List Items Addressed This Visit       Digestive   Calculus of gallbladder without cholecystitis without obstruction - Primary   Relevant Orders   [place]   Fatty liver   Relevant Orders   [place] Complete     Other   Renal mass   Relevant Orders   [place] Complete    No orders of the defined types were placed in this encounter.   Follow-up: Return in about 6 months (around 10/01/2021).  We discussed all 3 of the above issues.  Patient was given a copy of the ultrasound report and I asked him to call with any questions.  Regarding the gallstones noted.  The pain he experienced in the right upper quadrant with mild.  He still believes he could have been a sports related injury from playing soccer.  We discussed that with severe right upper quadrant pain he is to proceed to the emergency room or contact me.  We discussed the fatty liver pain a result of his elevated blood fats and alcohol usage.  His mother had elevated blood fat and patient's issue likely has a genetic component.  Stressed the importance of taking the fenofibrate.  Discussed that weight loss would be beneficial.  Renal mass is probably benign.  We are planning to follow it up in 6 months with another ultrasound to document stability.  Given information regarding "cholelithiasis, fatty liver and renal mass.  WLibby Maw MD

## 2021-04-17 ENCOUNTER — Telehealth (HOSPITAL_BASED_OUTPATIENT_CLINIC_OR_DEPARTMENT_OTHER): Payer: Self-pay

## 2021-05-07 ENCOUNTER — Ambulatory Visit (AMBULATORY_SURGERY_CENTER): Payer: Commercial Managed Care - PPO | Admitting: *Deleted

## 2021-05-07 ENCOUNTER — Other Ambulatory Visit: Payer: Self-pay

## 2021-05-07 VITALS — Ht 72.0 in | Wt 263.0 lb

## 2021-05-07 DIAGNOSIS — Z1211 Encounter for screening for malignant neoplasm of colon: Secondary | ICD-10-CM

## 2021-05-07 MED ORDER — PEG 3350-KCL-NA BICARB-NACL 420 G PO SOLR: 4000.0000 mL | Freq: Once | ORAL | 0 refills | Status: AC

## 2021-05-07 NOTE — Progress Notes (Signed)

## 2021-05-09 ENCOUNTER — Encounter: Payer: Self-pay | Admitting: Gastroenterology

## 2021-05-21 ENCOUNTER — Ambulatory Visit (AMBULATORY_SURGERY_CENTER): Payer: Commercial Managed Care - PPO | Admitting: Gastroenterology

## 2021-05-21 ENCOUNTER — Encounter: Payer: Self-pay | Admitting: Gastroenterology

## 2021-05-21 VITALS — BP 116/64 | HR 55 | Temp 98.2°F | Resp 13 | Ht 72.0 in | Wt 263.0 lb

## 2021-05-21 DIAGNOSIS — Z1211 Encounter for screening for malignant neoplasm of colon: Secondary | ICD-10-CM

## 2021-05-21 DIAGNOSIS — D127 Benign neoplasm of rectosigmoid junction: Secondary | ICD-10-CM

## 2021-05-21 MED ORDER — SODIUM CHLORIDE 0.9 % IV SOLN: 500.0000 mL | Freq: Once | INTRAVENOUS | Status: DC

## 2021-05-21 NOTE — Patient Instructions (Signed)
Handouts on hemorrhoids, diverticulosis, and polyps given to patient. Await pathology results. Repeat colonoscopy for surveillance will be determined based off of pathology results.   YOU HAD AN ENDOSCOPIC PROCEDURE TODAY AT [facility]:   Refer to the procedure report that was given to you for any specific questions about what was found during the examination.  If the procedure report does not answer your questions, please call your gastroenterologist to clarify.  If you requested that your care partner not be given the details of your procedure findings, then the procedure report has been included in a sealed envelope for you to review at your convenience later.  YOU SHOULD EXPECT: Some feelings of bloating in the abdomen. Passage of more gas than usual.  Walking can help get rid of the air that was put into your GI tract during the procedure and reduce the bloating. If you had a lower endoscopy (such as a colonoscopy or flexible sigmoidoscopy) you may notice spotting of blood in your stool or on the toilet paper. If you underwent a bowel prep for your procedure, you may not have a normal bowel movement for a few days.  Please Note:  You might notice some irritation and congestion in your nose or some drainage.  This is from the oxygen used during your procedure.  There is no need for concern and it should clear up in a day or so.  SYMPTOMS TO REPORT IMMEDIATELY:  Following lower endoscopy (colonoscopy or flexible sigmoidoscopy):  Excessive amounts of blood in the stool  Significant tenderness or worsening of abdominal pains  Swelling of the abdomen that is new, acute  Fever of 100F or higher  For urgent or emergent issues, a gastroenterologist can be reached at any hour by calling [phone]. Do not use MyChart messaging for urgent concerns.    DIET:  We do recommend a small meal at first, but then you may proceed to your regular diet.  Drink plenty of fluids but you  should avoid alcoholic beverages for 24 hours.  ACTIVITY:  You should plan to take it easy for the rest of today and you should NOT DRIVE or use heavy machinery until tomorrow (because of the sedation medicines used during the test).    FOLLOW UP: Our staff will call the number listed on your records 48-72 hours following your procedure to check on you and address any questions or concerns that you may have regarding the information given to you following your procedure. If we do not reach you, we will leave a message.  We will attempt to reach you two times.  During this call, we will ask if you have developed any symptoms of COVID 19. If you develop any symptoms (ie: fever, flu-like symptoms, shortness of breath, cough etc.) before then, please call [phone].  If you test positive for Covid 19 in the 2 weeks post procedure, please call and report this information to [place].    If any biopsies were taken you will be contacted by phone or by letter within the next 1-3 weeks.  Please call us at [phone] if you have not heard about the biopsies in 3 weeks.    SIGNATURES/CONFIDENTIALITY: You and/or your care partner have signed paperwork which will be entered into your electronic medical record.  These signatures attest to the fact that that the information above on your After Visit Summary has been reviewed and is understood.  Full responsibility of the confidentiality of this discharge information  lies with you and/or your care-partner.  

## 2021-05-21 NOTE — Progress Notes (Signed)
Called to room to assist during endoscopic procedure.  Patient ID and intended procedure confirmed with present staff. Received instructions for my participation in the procedure from the performing physician.  

## 2021-05-21 NOTE — Op Note (Signed)
[place] Patient Name: Johnathan Bailey Procedure Date: 05/21/2021 9:32 AM MRN: [id number] Endoscopist: Remo Lipps P. Havery Moros , MD Age: 50 Referring MD:  Date of Birth: 1971-01-12 Gender: Male Account #: [id number] Procedure:                Colonoscopy Indications:              Screening for colorectal malignant neoplasm, This                            is the patient's first colonoscopy Medicines:                Monitored Anesthesia Care Procedure:                Pre-Anesthesia Assessment:                           - Prior to the procedure, a History and Physical                            was performed, and patient medications and                            allergies were reviewed. The patient's tolerance of                            previous anesthesia was also reviewed. The risks                            and benefits of the procedure and the sedation                            options and risks were discussed with the patient.                            All questions were answered, and informed consent                            was obtained. Prior Anticoagulants: The patient has                            taken no previous anticoagulant or antiplatelet                            agents. ASA Grade Assessment: II - A patient with                            mild systemic disease. After reviewing the risks                            and benefits, the patient was deemed in                            satisfactory condition to undergo the procedure.  After obtaining informed consent, the colonoscope                            was passed under direct vision. Throughout the                            procedure, the patient's blood pressure, pulse, and                            oxygen saturations were monitored continuously. The                            CF [id number] #[id number] was introduced through the anus                            and advanced  to the the cecum, identified by                            appendiceal orifice and ileocecal valve. The                            colonoscopy was performed without difficulty. The                            patient tolerated the procedure well. The quality                            of the bowel preparation was good. The ileocecal                            valve, appendiceal orifice, and rectum were                            photographed. Scope In: 9:39:53 AM Scope Out: 9:59:52 AM Scope Withdrawal Time: 0 hours 16 minutes 42 seconds  Total Procedure Duration: 0 hours 19 minutes 59 seconds  Findings:                 The perianal and digital rectal examinations were                            normal.                           Multiple small-mouthed diverticula were found in                            the sigmoid colon and ascending colon.                           A 3 to 4 mm polyp was found in the recto-sigmoid                            colon. The polyp was sessile. The polyp was removed  with a cold snare. Resection and retrieval were                            complete.                           The colon was spastic which prolonged the exam.                           Internal hemorrhoids were found during                            retroflexion. The hemorrhoids were small.                           The exam was otherwise without abnormality. Complications:            No immediate complications. Estimated blood loss:                            Minimal. Estimated Blood Loss:     Estimated blood loss was minimal. Impression:               - Diverticulosis in the sigmoid colon and in the                            ascending colon.                           - One 3 to 4 mm polyp at the recto-sigmoid colon,                            removed with a cold snare. Resected and retrieved.                           - Colonic spasm.                           -  Internal hemorrhoids.                           - The examination was otherwise normal. Recommendation:           - Patient has a contact number available for                            emergencies. The signs and symptoms of potential                            delayed complications were discussed with the                            patient. Return to normal activities tomorrow.                            Written discharge instructions were provided to the  patient.                           - Resume previous diet.                           - Continue present medications.                           - Await pathology results. Remo Lipps P. Cherrise Occhipinti, MD 05/21/2021 10:03:45 AM This report has been signed electronically.

## 2021-05-21 NOTE — Progress Notes (Signed)
VS by DT  Pt's states no medical or surgical changes since previsit or office visit.  

## 2021-05-21 NOTE — Progress Notes (Signed)
[facility] History and Physical   Primary Care Physician:  [name], MD   Reason for Procedure:   Colon cancer screening  Plan:    colonoscopy     HPI: Johnathan Bailey is a 50 y.o. male  here for colonoscopy screening - first time exam. Patient denies any bowel symptoms at this time. No family history of colon cancer known. Otherwise feels well without any cardiopulmonary symptoms.    Past Medical History:  Diagnosis Date   Fatty liver    Hyperlipidemia     Past Surgical History:  Procedure Laterality Date   HERNIA REPAIR     KNEE ARTHROSCOPY W/ MENISCAL REPAIR Right    x2    Prior to Admission medications   Medication Sig Start Date End Date Taking? Authorizing Provider  fenofibrate (TRICOR) 145 MG tablet Take 1 tablet (145 mg total) by mouth daily. 03/14/21  Yes [name], MD  omega-3 acid ethyl esters (LOVAZA) 1 g capsule Take by mouth daily.   Yes [provider]    Current Outpatient Medications  Medication Sig Dispense Refill   fenofibrate (TRICOR) 145 MG tablet Take 1 tablet (145 mg total) by mouth daily. 90 tablet 1   omega-3 acid ethyl esters (LOVAZA) 1 g capsule Take by mouth daily.     Current Facility-Administered Medications  Medication Dose Route Frequency Provider Last Rate Last Admin   0.9 %  sodium chloride infusion  500 mL Intravenous Once Geet Hosking, Carlota Raspberry, MD        Allergies as of 05/21/2021   (No Known Allergies)    Family History  Problem Relation Age of Onset   Colon cancer Neg Hx    Colon polyps Neg Hx    Esophageal cancer Neg Hx    Rectal cancer Neg Hx    Stomach cancer Neg Hx     Social History   Socioeconomic History   Marital status: Single    Spouse name: Not on file   Number of children: Not on file   Years of education: Not on file   Highest education level: Not on file  Occupational History   Not on file  Tobacco Use   Smoking status: Never   Smokeless  tobacco: Never  Vaping Use   Vaping Use: Never used  Substance and Sexual Activity   Alcohol use: Not Currently   Drug use: No   Sexual activity: Not on file  Other Topics Concern   Not on file  Social History Narrative   Not on file   Social Determinants of Health   Financial Resource Strain: Not on file  Food Insecurity: Not on file  Transportation Needs: Not on file  Physical Activity: Not on file  Stress: Not on file  Social Connections: Not on file  Intimate Partner Violence: Not on file    Review of Systems: All other review of systems negative except as mentioned in the HPI.  Physical Exam: Vital signs BP 123/69   Pulse 63   Temp 98.2 F (36.8 C) (Skin)   Resp 16   Ht 6' (1.829 m)   Wt 263 lb (119.3 kg)   SpO2 99%   BMI 35.67 kg/m   General:   Alert,  Well-developed, pleasant and cooperative in NAD Lungs:  Clear throughout to auscultation.   Heart:  Regular rate and rhythm Abdomen:  Soft, nontender and nondistended.   Neuro/Psych:  Alert and cooperative. Normal mood and affect. A and O x  [place], MD [facility] ]

## 2021-05-21 NOTE — Progress Notes (Signed)
Report given to PACU, vss 

## 2021-05-23 ENCOUNTER — Telehealth: Payer: Self-pay

## 2021-05-23 ENCOUNTER — Telehealth (HOSPITAL_BASED_OUTPATIENT_CLINIC_OR_DEPARTMENT_OTHER): Payer: Self-pay

## 2021-05-23 NOTE — Telephone Encounter (Signed)
  Follow up Call-  Call back number 05/21/2021  Post procedure Call Back phone  # [phone]  Permission to leave phone message Yes  Some recent data might be hidden     Patient questions:  Do you have a fever, pain , or abdominal swelling? No. Pain Score  0 *  Have you tolerated food without any problems? Yes.    Have you been able to return to your normal activities? Yes.    Do you have any questions about your discharge instructions: Diet   No. Medications  No. Follow up visit  No.  Do you have questions or concerns about your Care? No.  Actions: * If pain score is 4 or above: No action needed, pain <4.

## 2021-07-03 ENCOUNTER — Other Ambulatory Visit: Payer: Self-pay | Admitting: Family Medicine

## 2021-07-03 DIAGNOSIS — E782 Mixed hyperlipidemia: Secondary | ICD-10-CM

## 2021-09-29 ENCOUNTER — Telehealth (HOSPITAL_BASED_OUTPATIENT_CLINIC_OR_DEPARTMENT_OTHER): Payer: Self-pay

## 2021-09-29 ENCOUNTER — Other Ambulatory Visit: Payer: Self-pay

## 2021-10-01 ENCOUNTER — Ambulatory Visit: Payer: Commercial Managed Care - PPO | Admitting: Family Medicine

## 2021-10-01 ENCOUNTER — Encounter: Payer: Self-pay | Admitting: Family Medicine

## 2021-10-01 ENCOUNTER — Other Ambulatory Visit: Payer: Self-pay

## 2021-10-01 VITALS — BP 116/74 | HR 63 | Temp 97.5°F | Ht 72.0 in | Wt 255.4 lb

## 2021-10-01 DIAGNOSIS — K8020 Calculus of gallbladder without cholecystitis without obstruction: Secondary | ICD-10-CM | POA: Diagnosis not present

## 2021-10-01 DIAGNOSIS — N2889 Other specified disorders of kidney and ureter: Secondary | ICD-10-CM | POA: Diagnosis not present

## 2021-10-01 DIAGNOSIS — R499 Unspecified voice and resonance disorder: Secondary | ICD-10-CM

## 2021-10-01 DIAGNOSIS — E782 Mixed hyperlipidemia: Secondary | ICD-10-CM

## 2021-10-01 DIAGNOSIS — M79673 Pain in unspecified foot: Secondary | ICD-10-CM | POA: Insufficient documentation

## 2021-10-01 DIAGNOSIS — K760 Fatty (change of) liver, not elsewhere classified: Secondary | ICD-10-CM | POA: Diagnosis not present

## 2021-10-01 NOTE — Progress Notes (Signed)
? ?Established Patient Office Visit ? ?Subjective:  ?Patient ID: Johnathan Bailey, male    DOB: 1971/07/22  Age: 51 y.o. MRN: [id number] ? ?CC:  ?Chief Complaint  ?Patient presents with  ? Follow-up  ?  6 month follow up, no concerns. Patient not fasting.   ? ? ?HPI ?Johnathan Bailey presents for follow-up of Coley lithiasis, fatty liver, renal mass.  Has been exercising and is greatly reduced the fat and cholesterol in his diet.  Denies abdominal pain difficulty with urination or changes in stooling patterns.  He tells of difficulty with laughing.  Friends have felt as though his voice is changed.  He has never smoked.  Denies reflux.  Denies postnasal drip. ?Past Medical History:  ?Diagnosis Date  ? Fatty liver   ? Hyperlipidemia   ? ? ?Past Surgical History:  ?Procedure Laterality Date  ? HERNIA REPAIR    ? KNEE ARTHROSCOPY W/ MENISCAL REPAIR Right   ? x2  ? ? ?Family History  ?Problem Relation Age of Onset  ? Colon cancer Neg Hx   ? Colon polyps Neg Hx   ? Esophageal cancer Neg Hx   ? Rectal cancer Neg Hx   ? Stomach cancer Neg Hx   ? ? ?Social History  ? ?Socioeconomic History  ? Marital status: Single  ?  Spouse name: Not on file  ? Number of children: Not on file  ? Years of education: Not on file  ? Highest education level: Not on file  ?Occupational History  ? Not on file  ?Tobacco Use  ? Smoking status: Never  ? Smokeless tobacco: Never  ?Vaping Use  ? Vaping Use: Never used  ?Substance and Sexual Activity  ? Alcohol use: Not Currently  ? Drug use: No  ? Sexual activity: Not on file  ?Other Topics Concern  ? Not on file  ?Social History Narrative  ? Not on file  ? ?Social Determinants of Health  ? ?Financial Resource Strain: Not on file  ?Food Insecurity: Not on file  ?Transportation Needs: Not on file  ?Physical Activity: Not on file  ?Stress: Not on file  ?Social Connections: Not on file  ?Intimate Partner Violence: Not on file  ? ? ?Outpatient Medications Prior to Visit  ?Medication Sig Dispense  Refill  ? fenofibrate (TRICOR) 145 MG tablet TAKE 1 TABLET(145 MG) BY MOUTH DAILY 90 tablet 1  ? omega-3 acid ethyl esters (LOVAZA) 1 g capsule Take by mouth daily.    ? ?No facility-administered medications prior to visit.  ? ? ?No Known Allergies ? ?ROS ?Review of Systems  ?Constitutional:  Negative for diaphoresis, fatigue, fever and unexpected weight change.  ?HENT:  Positive for voice change. Negative for congestion, postnasal drip and trouble swallowing.   ?Eyes:  Negative for photophobia and visual disturbance.  ?Respiratory:  Negative for cough, shortness of breath and wheezing.   ?Cardiovascular: Negative.   ?Gastrointestinal: Negative.   ?Genitourinary:  Negative for difficulty urinating, frequency and urgency.  ?Neurological:  Negative for speech difficulty, weakness and light-headedness.  ?Psychiatric/Behavioral: Negative.    ? ?  ?Objective:  ?  ?Physical Exam ?Vitals and nursing note reviewed.  ?Constitutional:   ?   General: He is not in acute distress. ?   Appearance: Normal appearance. He is not ill-appearing, toxic-appearing or diaphoretic.  ?HENT:  ?   Head: Normocephalic and atraumatic.  ?   Right Ear: External ear normal.  ?   Left Ear: External ear normal.  ?  Mouth/Throat:  ?   Mouth: Mucous membranes are moist.  ?   Pharynx: Oropharynx is clear. No oropharyngeal exudate or posterior oropharyngeal erythema.  ?Eyes:  ?   Extraocular Movements: Extraocular movements intact.  ?   Conjunctiva/sclera: Conjunctivae normal.  ?   Pupils: Pupils are equal, round, and reactive to light.  ?Cardiovascular:  ?   Rate and Rhythm: Normal rate and regular rhythm.  ?Pulmonary:  ?   Effort: Pulmonary effort is normal. No respiratory distress.  ?   Breath sounds: Normal breath sounds. No wheezing, rhonchi or rales.  ?Musculoskeletal:  ?   Cervical back: No rigidity or tenderness.  ?   Right lower leg: No edema.  ?   Left lower leg: No edema.  ?Lymphadenopathy:  ?   Cervical: No cervical adenopathy.  ?Skin: ?    General: Skin is warm and dry.  ?Neurological:  ?   Mental Status: He is alert and oriented to person, place, and time.  ?Psychiatric:     ?   Mood and Affect: Mood normal.     ?   Behavior: Behavior normal.  ? ? ?BP 116/74 (BP Location: Right Arm, Patient Position: Sitting, Cuff Size: Large)   Pulse 63   Temp (!) 97.5 ?F (36.4 ?C) (Temporal)   Ht 6' (1.829 m)   Wt 255 lb 6.4 oz (115.8 kg)   SpO2 97%   BMI 34.64 kg/m?  ?Wt Readings from Last 3 Encounters:  ?10/01/21 255 lb 6.4 oz (115.8 kg)  ?05/21/21 263 lb (119.3 kg)  ?05/07/21 263 lb (119.3 kg)  ? ? ? ?Health Maintenance Due  ?Topic Date Due  ? Hepatitis C Screening  Never done  ? ? ?There are no preventive care reminders to display for this patient. ? ?Lab Results  ?Component Value Date  ? TSH 1.80 10/19/2019  ? ?Lab Results  ?Component Value Date  ? WBC 6.6 03/13/2021  ? HGB 15.6 03/13/2021  ? HCT 45.8 03/13/2021  ? MCV 86.1 03/13/2021  ? PLT 273.0 03/13/2021  ? ?Lab Results  ?Component Value Date  ? NA 138 03/13/2021  ? K 4.4 03/13/2021  ? CO2 25 03/13/2021  ? GLUCOSE 88 03/13/2021  ? BUN 12 03/13/2021  ? CREATININE 0.83 03/13/2021  ? BILITOT 1.3 (H) 03/13/2021  ? ALKPHOS 63 03/13/2021  ? AST 29 03/13/2021  ? ALT 46 03/13/2021  ? PROT 7.6 03/13/2021  ? ALBUMIN 4.6 03/13/2021  ? CALCIUM 9.6 03/13/2021  ? GFR 102.75 03/13/2021  ? ?Lab Results  ?Component Value Date  ? CHOL 199 03/13/2021  ? ?Lab Results  ?Component Value Date  ? HDL 31.80 (L) 03/13/2021  ? ?No results found for: [place] ?Lab Results  ?Component Value Date  ? TRIG (H) 03/13/2021  ?  636.0 Triglyceride is over 400; calculations on Lipids are invalid.  ? ?Lab Results  ?Component Value Date  ? CHOLHDL 6 03/13/2021  ? ?Lab Results  ?Component Value Date  ? HGBA1C 5.8 03/13/2021  ? ? ?  ?Assessment & Plan:  ? ?Problem List Items Addressed This Visit   ? ?  ? Digestive  ? Calculus of gallbladder without cholecystitis without obstruction  ? Relevant Orders  ? CT Abdomen Pelvis Wo Contrast  ?  Comprehensive metabolic panel  ? Fatty liver  ? Relevant Orders  ? CT Abdomen Pelvis W Contrast  ? Comprehensive metabolic panel  ? LDL cholesterol, direct  ? Lipid panel  ?  ? Other  ? Elevated triglycerides with  high cholesterol  ? Relevant Orders  ? Comprehensive metabolic panel  ? LDL cholesterol, direct  ? Lipid panel  ? Renal mass - Primary  ? Relevant Orders  ? CT Abdomen Pelvis W Contrast  ? CT Abdomen Pelvis Wo Contrast  ? CBC  ? Comprehensive metabolic panel  ? Urinalysis, Routine w reflex microscopic  ? Change in voice  ? Relevant Orders  ? Ambulatory referral to ENT  ? ?Other Visit Diagnoses   ? ? Other specified disorders of kidney and ureter      ? Relevant Orders  ? CT Abdomen Pelvis W Contrast  ? CT Abdomen Pelvis Wo Contrast  ? ?  ? ? ?No orders of the defined types were placed in this encounter. ? ? ?Follow-up: Return in about 6 months (around 04/03/2022), or if symptoms worsen or fail to improve.  ?Will return in a.m. fasting for blood work.  ENT referral for voice change.  CT scan for follow-up of renal mass. ? ?[name], MD ?

## 2021-10-02 ENCOUNTER — Other Ambulatory Visit (INDEPENDENT_AMBULATORY_CARE_PROVIDER_SITE_OTHER): Payer: Commercial Managed Care - PPO

## 2021-10-02 DIAGNOSIS — K8020 Calculus of gallbladder without cholecystitis without obstruction: Secondary | ICD-10-CM

## 2021-10-02 DIAGNOSIS — K760 Fatty (change of) liver, not elsewhere classified: Secondary | ICD-10-CM | POA: Diagnosis not present

## 2021-10-02 DIAGNOSIS — E782 Mixed hyperlipidemia: Secondary | ICD-10-CM

## 2021-10-02 DIAGNOSIS — N2889 Other specified disorders of kidney and ureter: Secondary | ICD-10-CM

## 2021-10-02 LAB — LIPID PANEL
Cholesterol: 167 mg/dL (ref 0–200)
HDL: 37.7 mg/dL — ABNORMAL LOW (ref >39.00)
LDL Cholesterol: 103 mg/dL — ABNORMAL HIGH (ref 0–99)
NonHDL: 129.54
Total CHOL/HDL Ratio: 4
Triglycerides: 131 mg/dL (ref 0.0–149.0)
VLDL: 26.2 mg/dL (ref 0.0–40.0)

## 2021-10-02 LAB — URINALYSIS, ROUTINE W REFLEX MICROSCOPIC
Bilirubin Urine: NEGATIVE
Hgb urine dipstick: NEGATIVE
Ketones, ur: NEGATIVE
Leukocytes,Ua: NEGATIVE
Nitrite: NEGATIVE
RBC / HPF: NONE SEEN (ref 0–?)
Specific Gravity, Urine: 1.02 (ref 1.000–1.030)
Total Protein, Urine: NEGATIVE
Urine Glucose: NEGATIVE
Urobilinogen, UA: 0.2 (ref 0.0–1.0)
pH: 6 (ref 5.0–8.0)

## 2021-10-02 LAB — CBC
HCT: 44.4 % (ref 39.0–52.0)
Hemoglobin: 15.1 g/dL (ref 13.0–17.0)
MCHC: 34.1 g/dL (ref 30.0–36.0)
MCV: 86.6 fl (ref 78.0–100.0)
Platelets: 289 10*3/uL (ref 150.0–400.0)
RBC: 5.13 Mil/uL (ref 4.22–5.81)
RDW: 13.2 % (ref 11.5–15.5)
WBC: 6.4 10*3/uL (ref 4.0–10.5)

## 2021-10-02 LAB — COMPREHENSIVE METABOLIC PANEL
ALT: 22 U/L (ref 0–53)
AST: 21 U/L (ref 0–37)
Albumin: 4.6 g/dL (ref 3.5–5.2)
Alkaline Phosphatase: 51 U/L (ref 39–117)
BUN: 18 mg/dL (ref 6–23)
CO2: 27 mEq/L (ref 19–32)
Calcium: 9.4 mg/dL (ref 8.4–10.5)
Chloride: 105 mEq/L (ref 96–112)
Creatinine, Ser: 0.91 mg/dL (ref 0.40–1.50)
GFR: 98.57 mL/min (ref >60.00)
Glucose, Bld: 100 mg/dL — ABNORMAL HIGH (ref 70–99)
Potassium: 4.4 mEq/L (ref 3.5–5.1)
Sodium: 139 mEq/L (ref 135–145)
Total Bilirubin: 0.7 mg/dL (ref 0.2–1.2)
Total Protein: 7.3 g/dL (ref 6.0–8.3)

## 2021-10-02 LAB — LDL CHOLESTEROL, DIRECT: Direct LDL: 118 mg/dL

## 2021-10-08 NOTE — Addendum Note (Signed)
Addended by: Lynda Rainwater on: 10/08/2021 08:25 AM ? ? Modules accepted: Orders ? ?

## 2021-10-16 ENCOUNTER — Telehealth: Payer: Self-pay

## 2021-10-16 ENCOUNTER — Telehealth (HOSPITAL_BASED_OUTPATIENT_CLINIC_OR_DEPARTMENT_OTHER): Payer: Self-pay

## 2021-10-16 NOTE — Telephone Encounter (Signed)
Patient calling states that he received a call to schedule U/S for this week. Per patient he had a U/S done back in August 2022 not sure if he needed another. Please advise.  ?

## 2021-10-16 NOTE — Telephone Encounter (Signed)
Patient aware of message below.

## 2022-01-02 IMAGING — US US ABDOMEN COMPLETE
1 series · 13 of 25 positions shown · non-contrast
Comparison: None available.

CLINICAL DATA: Initial evaluation for right upper quadrant
discomfort, elevated triglycerides.

EXAM:
ABDOMEN ULTRASOUND COMPLETE

[Series 1: us abdomen complete · 13 of 80 slices shown]
[im 1/80]
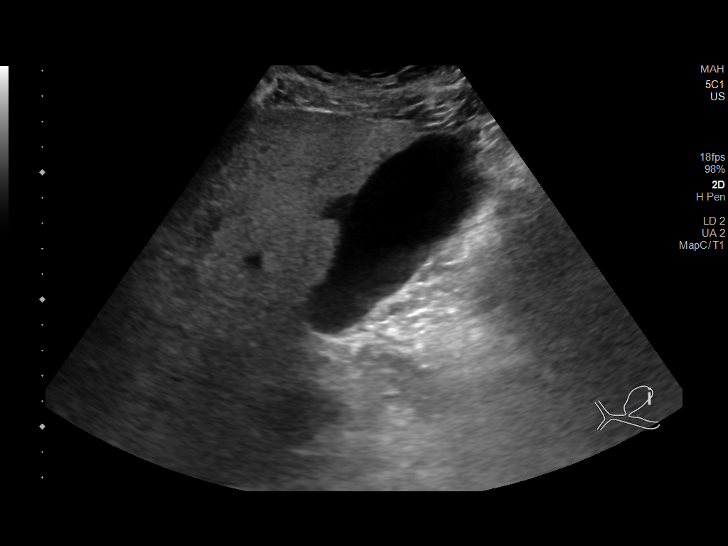
[im 7/80]
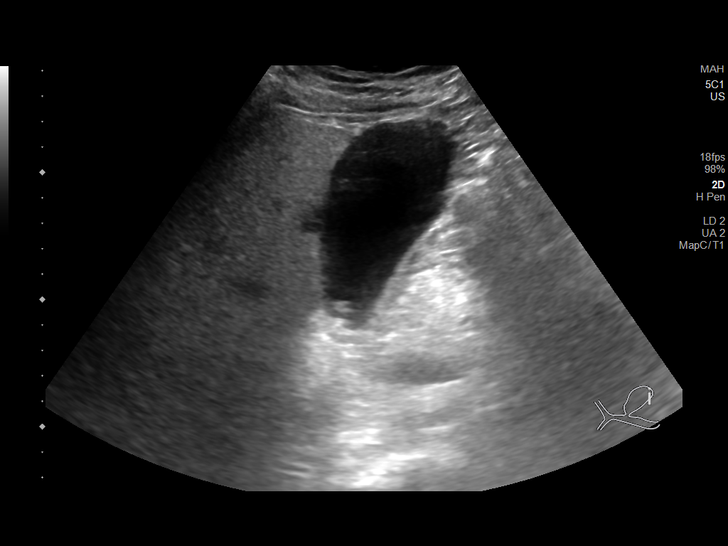
[im 14/80]
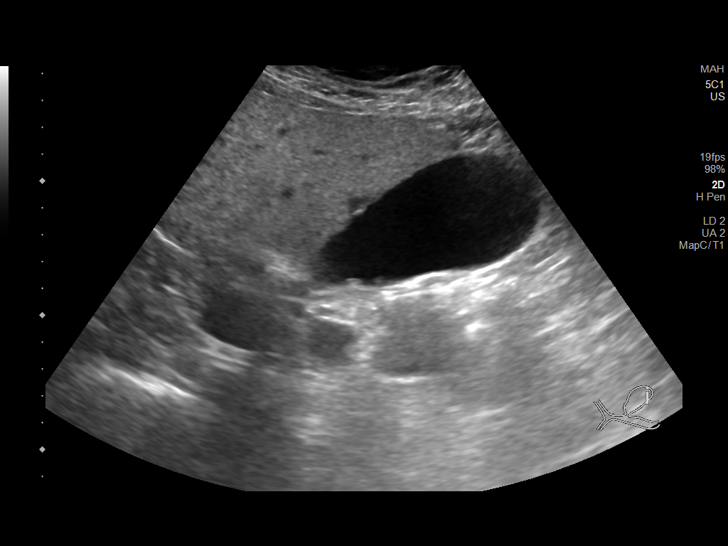
[im 20/80]
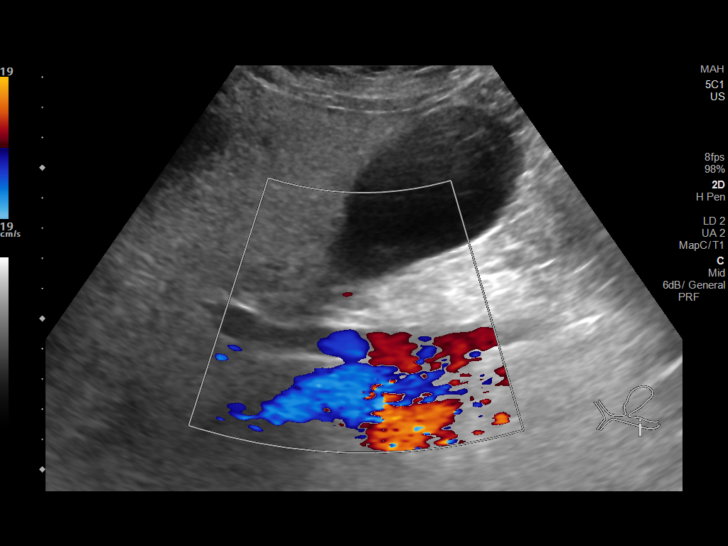
[im 27/80]
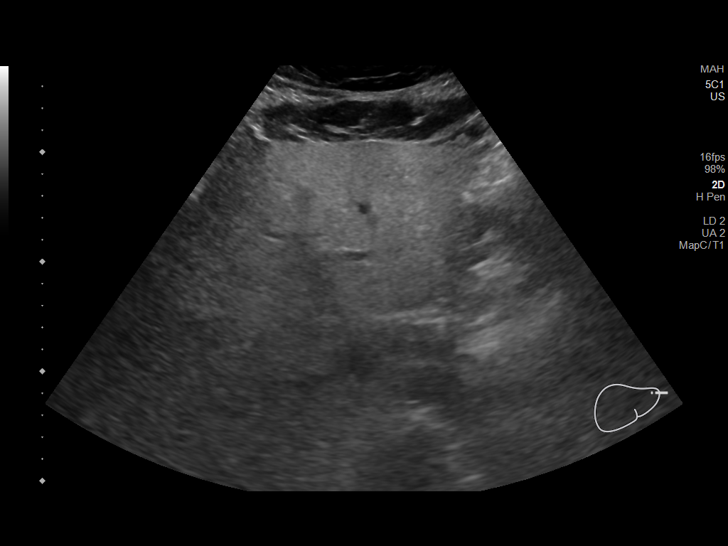
[im 33/80]
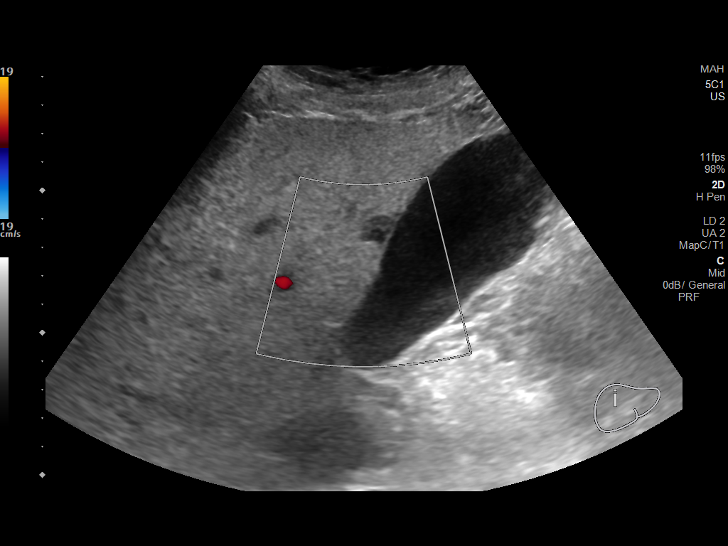
[im 40/80]
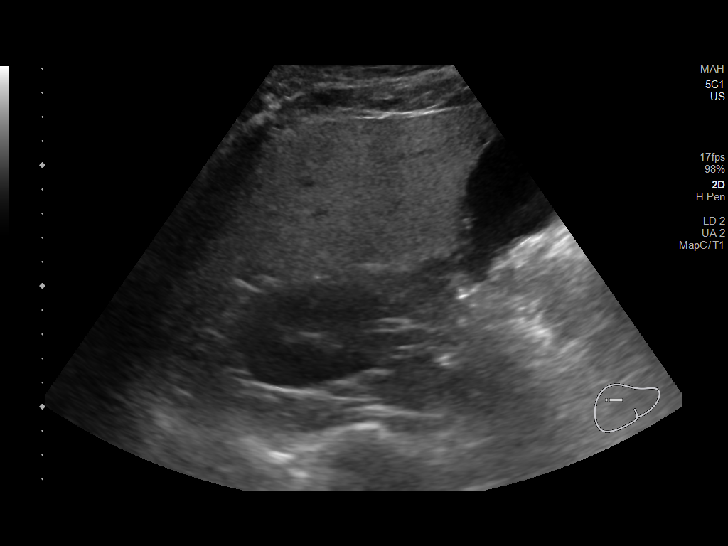
[im 47/80]
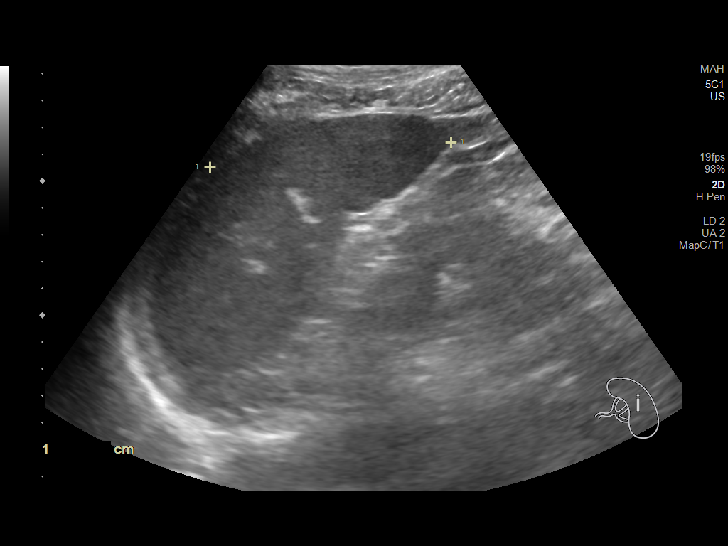
[im 53/80]
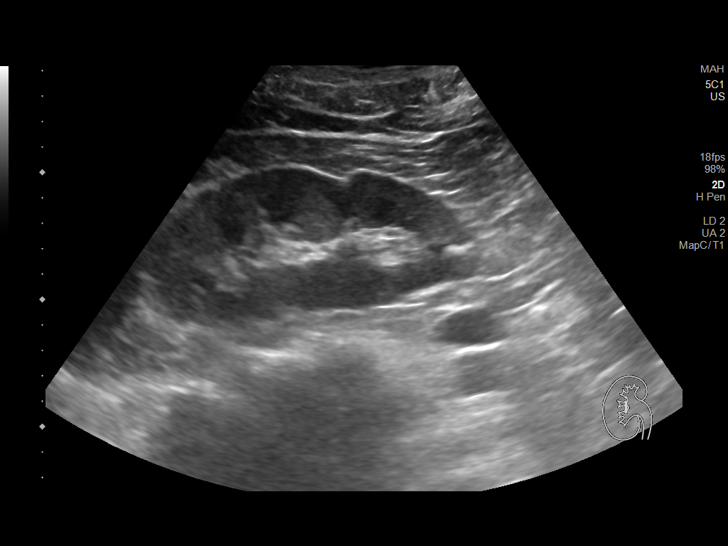
[im 60/80]
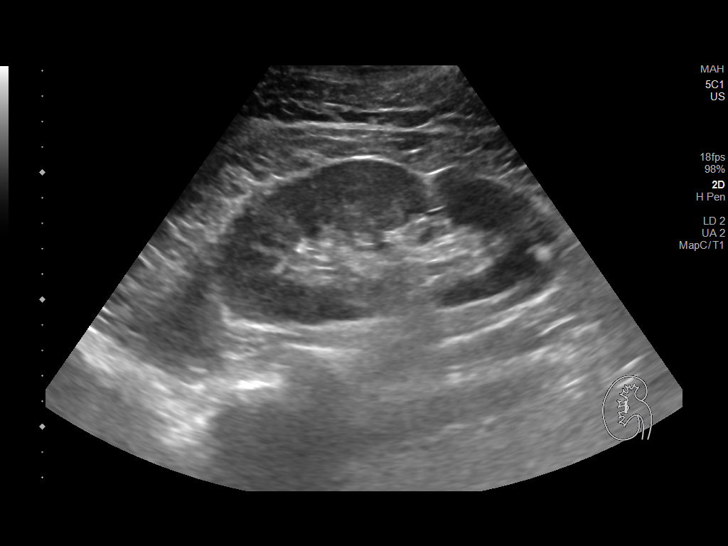
[im 66/80]
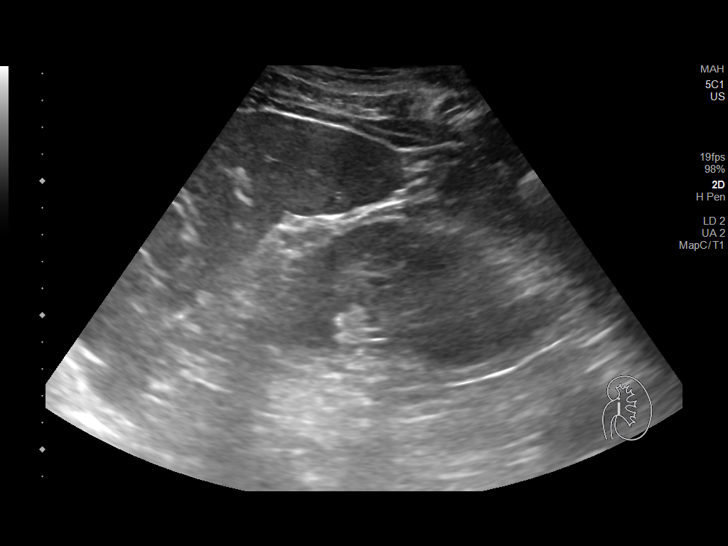
[im 73/80]
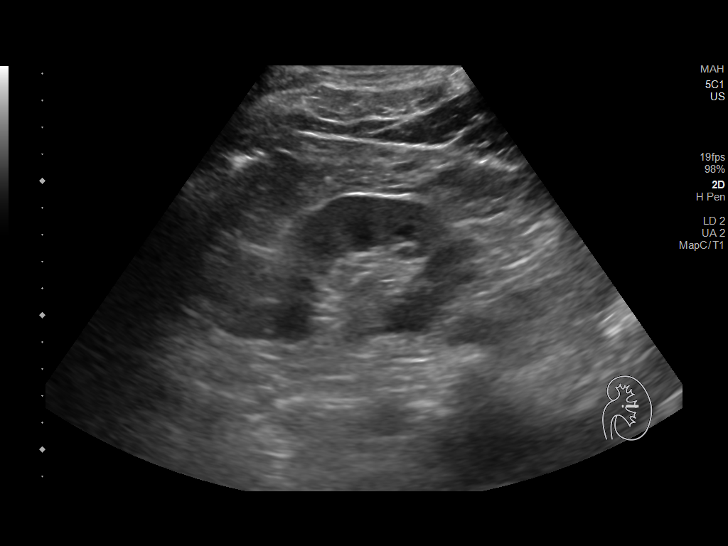
[im 80/80]
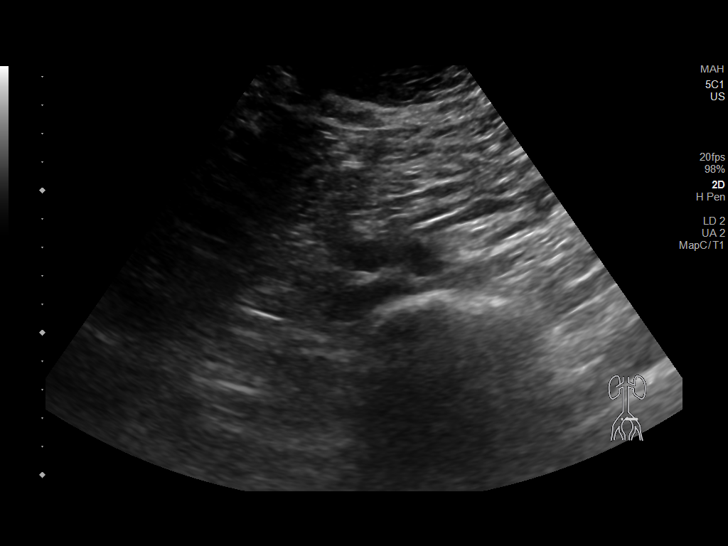

[13 of 25 positions shown; findings below may reference images not displayed]

FINDINGS: Gallbladder: Few small calculi seen within the gallbladder lumen,
largest of which measures 5 mm. Associated small amount of sludge.
Gallbladder wall measures within normal limits at 2.4 mm. No free
pericholecystic fluid. No sonographic Murphy sign elicited on exam.

Common bile duct: Diameter: 2.8 mm

Liver: Dense echogenic echotexture seen within the liver. Focal
fatty sparing noted adjacent to the gallbladder fossa. No other
focal lesion. Portal vein is patent on color Doppler imaging with
normal direction of blood flow towards the liver.

IVC: No abnormality visualized.

Pancreas: Visualized portion unremarkable.

Spleen: Size and appearance within normal limits.

Right Kidney: Length: 13.8 cm. Renal echogenicity within normal
limits. 1.2 cm somewhat linear hyperechoic focus seen involving the
cortex of the lower pole, nonspecific (image 57). No other echogenic
calculi or focal renal mass. No hydronephrosis. Focal cortical
scarring noted at the interpolar region.

Left Kidney: Length: 14.0 cm. Renal echogenicity within normal
limits. No nephrolithiasis or hydronephrosis. No focal renal mass.

Abdominal aorta: No aneurysm visualized.

Other findings: None.
IMPRESSION: 1. Cholelithiasis without evidence for acute cholecystitis. No
biliary dilatation.
2. Dense echogenic echotexture within the liver, suggesting fatty
infiltration.
3. 1.2 cm hyperechoic focus within the cortex of the lower pole of
the right kidney, indeterminate. Finding is nonspecific, with
primary differential considerations including a small
angiomyolipoma, partially calcified cyst, or possibly focal
scarring. A nonobstructive calculus is felt to be unlikely given the
cortical location. Further assessment with dedicated renal protocol
CT could be performed for further evaluation as warranted.
4. Otherwise unremarkable abdominal ultrasound. No other acute or
significant finding identified.

## 2022-04-02 ENCOUNTER — Encounter: Payer: Self-pay | Admitting: Family Medicine

## 2022-04-02 ENCOUNTER — Ambulatory Visit: Payer: Commercial Managed Care - PPO | Admitting: Family Medicine

## 2022-04-02 ENCOUNTER — Ambulatory Visit (INDEPENDENT_AMBULATORY_CARE_PROVIDER_SITE_OTHER): Payer: Commercial Managed Care - PPO

## 2022-04-02 VITALS — BP 118/76 | HR 84 | Temp 96.9°F | Ht 72.0 in | Wt 265.4 lb

## 2022-04-02 DIAGNOSIS — R0600 Dyspnea, unspecified: Secondary | ICD-10-CM

## 2022-04-02 DIAGNOSIS — K760 Fatty (change of) liver, not elsewhere classified: Secondary | ICD-10-CM

## 2022-04-02 DIAGNOSIS — Z125 Encounter for screening for malignant neoplasm of prostate: Secondary | ICD-10-CM

## 2022-04-02 DIAGNOSIS — E782 Mixed hyperlipidemia: Secondary | ICD-10-CM

## 2022-04-02 DIAGNOSIS — K8020 Calculus of gallbladder without cholecystitis without obstruction: Secondary | ICD-10-CM | POA: Diagnosis not present

## 2022-04-02 DIAGNOSIS — Z0000 Encounter for general adult medical examination without abnormal findings: Secondary | ICD-10-CM

## 2022-04-02 DIAGNOSIS — N2889 Other specified disorders of kidney and ureter: Secondary | ICD-10-CM | POA: Diagnosis not present

## 2022-04-02 LAB — URINALYSIS, ROUTINE W REFLEX MICROSCOPIC
Bilirubin Urine: NEGATIVE
Hgb urine dipstick: NEGATIVE
Ketones, ur: NEGATIVE
Leukocytes,Ua: NEGATIVE
Nitrite: NEGATIVE
Specific Gravity, Urine: 1.025 (ref 1.000–1.030)
Total Protein, Urine: NEGATIVE
Urine Glucose: NEGATIVE
Urobilinogen, UA: 0.2 (ref 0.0–1.0)
pH: 6 (ref 5.0–8.0)

## 2022-04-02 LAB — COMPREHENSIVE METABOLIC PANEL
ALT: 34 U/L (ref 0–53)
AST: 23 U/L (ref 0–37)
Albumin: 4.6 g/dL (ref 3.5–5.2)
Alkaline Phosphatase: 62 U/L (ref 39–117)
BUN: 21 mg/dL (ref 6–23)
CO2: 27 mEq/L (ref 19–32)
Calcium: 9.9 mg/dL (ref 8.4–10.5)
Chloride: 102 mEq/L (ref 96–112)
Creatinine, Ser: 0.9 mg/dL (ref 0.40–1.50)
GFR: 99.53 mL/min (ref >60.00)
Glucose, Bld: 96 mg/dL (ref 70–99)
Potassium: 4.4 mEq/L (ref 3.5–5.1)
Sodium: 140 mEq/L (ref 135–145)
Total Bilirubin: 1.1 mg/dL (ref 0.2–1.2)
Total Protein: 7.6 g/dL (ref 6.0–8.3)

## 2022-04-02 LAB — LIPID PANEL
Cholesterol: 234 mg/dL — ABNORMAL HIGH (ref 0–200)
HDL: 40.9 mg/dL (ref >39.00)
NonHDL: 193.08
Total CHOL/HDL Ratio: 6
Triglycerides: 286 mg/dL — ABNORMAL HIGH (ref 0.0–149.0)
VLDL: 57.2 mg/dL — ABNORMAL HIGH (ref 0.0–40.0)

## 2022-04-02 LAB — LDL CHOLESTEROL, DIRECT: Direct LDL: 156 mg/dL

## 2022-04-02 LAB — CBC
HCT: 45.8 % (ref 39.0–52.0)
Hemoglobin: 15.5 g/dL (ref 13.0–17.0)
MCHC: 33.8 g/dL (ref 30.0–36.0)
MCV: 86.9 fl (ref 78.0–100.0)
Platelets: 308 10*3/uL (ref 150.0–400.0)
RBC: 5.27 Mil/uL (ref 4.22–5.81)
RDW: 12.6 % (ref 11.5–15.5)
WBC: 7.6 10*3/uL (ref 4.0–10.5)

## 2022-04-02 LAB — PSA: PSA: 0.76 ng/mL (ref 0.10–4.00)

## 2022-04-02 NOTE — Progress Notes (Unsigned)
Initial visit for dyspnea "for quite awhile" He becomes winded whenever he talks for a long period of time, laughs or tries to sing. Does not seem to bother him with physical exertion.  No hx asthma. Never a smoker

## 2022-04-02 NOTE — Progress Notes (Signed)
Established Patient Office Visit  Subjective   Patient ID: Anees Vanecek, male    DOB: Oct 05, 1970  Age: 51 y.o. MRN: [id number]  Chief Complaint  Patient presents with   Follow-up    6 month follow up patient fasting states that he has uncomfortable feeling in abdomin, seems to be SOB more than normal even when talk he sometimes have to stop talking to get his breath.     HPI for follow-up of elevated triglycerides.  Has decreased the fat in his diet.  He is drinking less.  Not exercising regularly but is active on his job.  Explains that he becomes winded whenever he talks for a long period of time, laughs or tries to sing.  Does not seem to bother him with physical exertion.  There is no chest pain nausea or diaphoresis.  No asthma history.  He is never smoked.    Review of Systems  Constitutional: Negative.   HENT: Negative.    Eyes:  Negative for blurred vision, discharge and redness.  Respiratory:  Positive for shortness of breath. Negative for cough, sputum production and wheezing.   Cardiovascular: Negative.  Negative for chest pain and palpitations.  Gastrointestinal:  Negative for abdominal pain.  Genitourinary: Negative.   Musculoskeletal: Negative.  Negative for myalgias.  Skin:  Negative for rash.  Neurological:  Negative for tingling, loss of consciousness and weakness.  Endo/Heme/Allergies:  Negative for polydipsia.  Psychiatric/Behavioral: Negative.        Objective:     BP 118/76 (BP Location: Right Arm, Patient Position: Sitting, Cuff Size: Large)   Pulse 84   Temp (!) 96.9 F (36.1 C) (Temporal)   Ht 6' (1.829 m)   Wt 265 lb 6.4 oz (120.4 kg)   SpO2 95%   BMI 35.99 kg/m  Wt Readings from Last 3 Encounters:  04/02/22 265 lb 6.4 oz (120.4 kg)  10/01/21 255 lb 6.4 oz (115.8 kg)  05/21/21 263 lb (119.3 kg)      Physical Exam Constitutional:      General: He is not in acute distress.    Appearance: Normal appearance. He is not ill-appearing,  toxic-appearing or diaphoretic.  HENT:     Head: Normocephalic and atraumatic.     Right Ear: External ear normal.     Left Ear: External ear normal.     Mouth/Throat:     Mouth: Mucous membranes are moist.     Pharynx: Oropharynx is clear. No oropharyngeal exudate or posterior oropharyngeal erythema.  Eyes:     General: No scleral icterus.       Right eye: No discharge.        Left eye: No discharge.     Extraocular Movements: Extraocular movements intact.     Conjunctiva/sclera: Conjunctivae normal.     Pupils: Pupils are equal, round, and reactive to light.  Cardiovascular:     Rate and Rhythm: Normal rate and regular rhythm.  Pulmonary:     Effort: Pulmonary effort is normal. No respiratory distress.     Breath sounds: Normal breath sounds. No wheezing or rales.  Abdominal:     General: Bowel sounds are normal. There is no distension.     Tenderness: There is no abdominal tenderness. There is no guarding.  Musculoskeletal:     Cervical back: No rigidity or tenderness.  Skin:    General: Skin is warm and dry.  Neurological:     Mental Status: He is alert and oriented to person, place,  and time.  Psychiatric:        Mood and Affect: Mood normal.        Behavior: Behavior normal.      No results found for any visits on 04/02/22.    The 10-year ASCVD risk score (Arnett DK, et al., 2019) is: 3.1%    Assessment & Plan:   Problem List Items Addressed This Visit       Digestive   Calculus of gallbladder without cholecystitis without obstruction   Relevant Orders   MR ABDOMEN W WO CONTRAST   Fatty liver   Relevant Orders   MR ABDOMEN W [place] maintenance   Relevant Orders   PSA   Urinalysis, Routine w reflex microscopic   Elevated triglycerides with high cholesterol   Relevant Orders   Comprehensive metabolic panel   Lipid panel   Renal mass - Primary   Relevant Orders   CBC   MR ABDOMEN W WO CONTRAST   Other Visit Diagnoses      Dyspnea, unspecified type       Relevant Orders   CBC   DG Chest 2 View   Ambulatory referral to Pulmonology   Other specified disorders of kidney and ureter       Relevant Orders   MR ABDOMEN W WO CONTRAST       Return in about 3 months (around 07/02/2022).  Never heard for follow-up CT ordered at last visit.  Asked him to call us in a month week or 2 if he has not heard from the scheduler.  Pulmonology referral for dyspnea reported above.  [name], MD

## 2022-04-07 ENCOUNTER — Ambulatory Visit (HOSPITAL_BASED_OUTPATIENT_CLINIC_OR_DEPARTMENT_OTHER)
Admission: RE | Admit: 2022-04-07 | Discharge: 2022-04-07 | Disposition: A | Discharge status: Home / Self Care | Payer: Commercial Managed Care - PPO | Source: Ambulatory Visit | Attending: Family Medicine | Admitting: Family Medicine

## 2022-04-07 ENCOUNTER — Encounter (HOSPITAL_BASED_OUTPATIENT_CLINIC_OR_DEPARTMENT_OTHER): Payer: Self-pay

## 2022-04-07 DIAGNOSIS — K8020 Calculus of gallbladder without cholecystitis without obstruction: Secondary | ICD-10-CM | POA: Insufficient documentation

## 2022-04-07 DIAGNOSIS — N2889 Other specified disorders of kidney and ureter: Secondary | ICD-10-CM | POA: Insufficient documentation

## 2022-04-07 MED ORDER — IOHEXOL 300 MG/ML  SOLN
100.0000 mL | Freq: Once | INTRAMUSCULAR | Status: AC | PRN
Start: 2022-04-07 — End: 2022-04-07
  Administered 2022-04-07: 125 mL via INTRAVENOUS

## 2022-04-16 ENCOUNTER — Encounter: Payer: Self-pay | Admitting: Family Medicine

## 2022-04-16 ENCOUNTER — Ambulatory Visit (INDEPENDENT_AMBULATORY_CARE_PROVIDER_SITE_OTHER): Payer: Commercial Managed Care - PPO | Admitting: Family Medicine

## 2022-04-16 VITALS — BP 132/76 | HR 81 | Temp 97.7°F | Ht 72.0 in | Wt 263.2 lb

## 2022-04-16 DIAGNOSIS — K8020 Calculus of gallbladder without cholecystitis without obstruction: Secondary | ICD-10-CM

## 2022-04-16 DIAGNOSIS — K760 Fatty (change of) liver, not elsewhere classified: Secondary | ICD-10-CM | POA: Diagnosis not present

## 2022-04-16 NOTE — Progress Notes (Signed)
   Established Patient Office Visit  Subjective   Patient ID: Johnathan Bailey, male    DOB: 1971/06/25  Age: 51 y.o. MRN: [id number]  Chief Complaint  Patient presents with   Advice Only    Discuss CT results.     HPI follow-up recent CT scan.  Significant finding of fatty liver disease and small gallstones.  Patient denies any history of severe right upper quadrant pain.  Triglycerides have been lower in the past after weight loss and dietary changes with lower fat and cholesterol foods consumed.  Wife has of breast biopsy pending for a suspicious lesion.  She has no health insurance.    Review of Systems  Constitutional: Negative.   HENT: Negative.    Eyes:  Negative for blurred vision, discharge and redness.  Respiratory: Negative.    Cardiovascular: Negative.   Gastrointestinal:  Negative for abdominal pain, heartburn, nausea and vomiting.  Genitourinary: Negative.   Musculoskeletal: Negative.  Negative for myalgias.  Skin:  Negative for rash.  Neurological:  Negative for tingling, loss of consciousness and weakness.  Endo/Heme/Allergies:  Negative for polydipsia.      Objective:     BP 132/76 (BP Location: Right Arm, Patient Position: Sitting, Cuff Size: Large)   Pulse 81   Temp 97.7 F (36.5 C) (Temporal)   Ht 6' (1.829 m)   Wt 263 lb 3.2 oz (119.4 kg)   SpO2 97%   BMI 35.70 kg/m    Physical Exam Constitutional:      General: He is not in acute distress.    Appearance: Normal appearance. He is not ill-appearing, toxic-appearing or diaphoretic.  HENT:     Head: Normocephalic and atraumatic.     Right Ear: External ear normal.     Left Ear: External ear normal.  Eyes:     General: No scleral icterus.       Right eye: No discharge.        Left eye: No discharge.     Extraocular Movements: Extraocular movements intact.     Conjunctiva/sclera: Conjunctivae normal.  Pulmonary:     Effort: Pulmonary effort is normal. No respiratory distress.  Skin:     General: Skin is warm and dry.  Neurological:     Mental Status: He is alert and oriented to person, place, and time.  Psychiatric:        Mood and Affect: Mood normal.        Behavior: Behavior normal.      No results found for any visits on 04/16/22.    The 10-year ASCVD risk score (Arnett DK, et al., 2019) is: 5.6%    Assessment & Plan:   Problem List Items Addressed This Visit       Digestive   Calculus of gallbladder without cholecystitis without obstruction - Primary   Fatty liver    Return in about 6 months (around 10/15/2022).  Continue Tricor and omega fatty acids.  Will return to the gym and exercise.  Walking would be great.  Given information on the Mediterranean diet.  Given information on gallstones.  Johnathan Maw, MD

## 2022-04-28 ENCOUNTER — Other Ambulatory Visit: Payer: Self-pay | Admitting: Family Medicine

## 2022-04-28 DIAGNOSIS — E782 Mixed hyperlipidemia: Secondary | ICD-10-CM

## 2022-04-28 NOTE — Progress Notes (Unsigned)
   Synopsis: Referred in October 2023 for dyspnea  Subjective:   PATIENT ID: Johnathan Bailey: male DOB: 04-25-1971, MRN: [id number]   HPI  No chief complaint on file.   *** September 2023 primary care records reviewed where the patient was referred to [place] for shortness of breath.  He had a CT scan performed which showed evidence of fatty liver.  Past Medical History:  Diagnosis Date   Fatty liver    Hyperlipidemia      Family History  Problem Relation Age of Onset   Colon cancer Neg Hx    Colon polyps Neg Hx    Esophageal cancer Neg Hx    Rectal cancer Neg Hx    Stomach cancer Neg Hx      Social History   Socioeconomic History   Marital status: Single    Spouse name: Not on file   Number of children: Not on file   Years of education: Not on file   Highest education level: Not on file  Occupational History   Not on file  Tobacco Use   Smoking status: Never   Smokeless tobacco: Never  Vaping Use   Vaping Use: Never used  Substance and Sexual Activity   Alcohol use: Not Currently   Drug use: No   Sexual activity: Not on file  Other Topics Concern   Not on file  Social History Narrative   Not on file   Social Determinants of Health   Financial Resource Strain: Not on file  Food Insecurity: Not on file  Transportation Needs: Not on file  Physical Activity: Not on file  Stress: Not on file  Social Connections: Not on file  Intimate Partner Violence: Not on file     No Known Allergies   Outpatient Medications Prior to Visit  Medication Sig Dispense Refill   fenofibrate (TRICOR) 145 MG tablet TAKE 1 TABLET(145 MG) BY MOUTH DAILY 90 tablet 3   omega-3 acid ethyl esters (LOVAZA) 1 g capsule Take by mouth daily.     No facility-administered medications prior to visit.    ROS    Objective:  Physical Exam   There were no vitals filed for this visit.  ***  CBC    Component Value Date/Time   WBC 7.6 04/02/2022 0957   RBC 5.27  04/02/2022 0957   HGB 15.5 04/02/2022 0957   HCT 45.8 04/02/2022 0957   PLT 308.0 04/02/2022 0957   MCV 86.9 04/02/2022 0957   MCHC 33.8 04/02/2022 0957   RDW 12.6 04/02/2022 0957     Chest imaging: September 2023 two-view chest x-ray images independently reviewed showing nonspecific interstitial changes bilaterally upper lobe predominant September 2023 lung windows personally reviewed showing no significant pulmonary parenchymal disease, nonspecific septal interstitial changes which are minor.  PFT:  Labs: September 2023 hemoglobin 15.5 g/dL  Path:  Echo:  Heart Catheterization:       Assessment & Plan:   No diagnosis found.  Discussion: ***  Immunizations: Immunization History  Administered Date(s) Administered   Influenza Inj Mdck Quad Pf 05/13/2018   Influenza Whole 05/26/2019   Influenza-Unspecified 05/27/2017, 05/05/2021   PFIZER(Purple Top)SARS-COV-2 Vaccination 10/09/2019, 10/30/2019   Tdap 07/27/2010, 02/08/2015     Current Outpatient Medications:    fenofibrate (TRICOR) 145 MG tablet, TAKE 1 TABLET(145 MG) BY MOUTH DAILY, Disp: 90 tablet, Rfl: 3   omega-3 acid ethyl esters (LOVAZA) 1 g capsule, Take by mouth daily., Disp: , Rfl:

## 2022-04-29 ENCOUNTER — Ambulatory Visit: Payer: Commercial Managed Care - PPO | Admitting: Pulmonary Disease

## 2022-04-29 ENCOUNTER — Encounter: Payer: Self-pay | Admitting: Pulmonary Disease

## 2022-04-29 VITALS — BP 114/72 | HR 62 | Ht 72.0 in | Wt 258.0 lb

## 2022-04-29 DIAGNOSIS — R0600 Dyspnea, unspecified: Secondary | ICD-10-CM | POA: Diagnosis not present

## 2022-04-29 DIAGNOSIS — R499 Unspecified voice and resonance disorder: Secondary | ICD-10-CM

## 2022-04-29 NOTE — Patient Instructions (Signed)
Shortness of breath: I believe that this is most likely related to laryngopharyngeal reflux as noted on the otolaryngology visit from earlier this year Start taking over-the-counter Prilosec 20 mg daily for 3 weeks to see if this helps with the breathing difficulty you have experienced We will check a lung function test We will check a high-resolution CT scan of your chest for further evaluation  Abnormal chest x-ray: My own personal read of your chest x-ray suggest that there is some scarring in the upper lobes of your lungs Given the fact that you were hospitalized as a child for a respiratory problem we will get the CT scan of your chest to evaluate further  We will plan on seeing you back in 4 to 6 weeks or sooner if needed

## 2022-06-16 ENCOUNTER — Other Ambulatory Visit: Payer: Self-pay

## 2022-06-16 DIAGNOSIS — R0600 Dyspnea, unspecified: Secondary | ICD-10-CM

## 2022-06-17 ENCOUNTER — Ambulatory Visit (INDEPENDENT_AMBULATORY_CARE_PROVIDER_SITE_OTHER): Payer: Commercial Managed Care - PPO | Admitting: Pulmonary Disease

## 2022-06-17 ENCOUNTER — Ambulatory Visit: Payer: Commercial Managed Care - PPO | Admitting: Pulmonary Disease

## 2022-06-17 ENCOUNTER — Encounter: Payer: Self-pay | Admitting: Pulmonary Disease

## 2022-06-17 VITALS — BP 118/78 | HR 78 | Ht 73.0 in | Wt 261.0 lb

## 2022-06-17 DIAGNOSIS — R0600 Dyspnea, unspecified: Secondary | ICD-10-CM

## 2022-06-17 DIAGNOSIS — J383 Other diseases of vocal cords: Secondary | ICD-10-CM

## 2022-06-17 DIAGNOSIS — R9389 Abnormal findings on diagnostic imaging of other specified body structures: Secondary | ICD-10-CM | POA: Diagnosis not present

## 2022-06-17 LAB — PULMONARY FUNCTION TEST
DL/VA % pred: 136 %
DL/VA: 5.98 ml/min/mmHg/L
DLCO cor % pred: 120 %
DLCO cor: 38.67 ml/min/mmHg
DLCO unc % pred: 120 %
DLCO unc: 38.67 ml/min/mmHg
FEF 25-75 Post: 3.86 L/sec
FEF 25-75 Pre: 2.81 L/sec
FEF2575-%Change-Post: 37 %
FEF2575-%Pred-Post: 102 %
FEF2575-%Pred-Pre: 74 %
FEV1-%Change-Post: 8 %
FEV1-%Pred-Post: 82 %
FEV1-%Pred-Pre: 76 %
FEV1-Post: 3.57 L
FEV1-Pre: 3.3 L
FEV1FVC-%Change-Post: 7 %
FEV1FVC-%Pred-Pre: 100 %
FEV6-%Change-Post: 0 %
FEV6-%Pred-Post: 78 %
FEV6-%Pred-Pre: 78 %
FEV6-Post: 4.25 L
FEV6-Pre: 4.22 L
FEV6FVC-%Pred-Post: 103 %
FEV6FVC-%Pred-Pre: 103 %
FVC-%Change-Post: 0 %
FVC-%Pred-Post: 76 %
FVC-%Pred-Pre: 75 %
FVC-Post: 4.25 L
FVC-Pre: 4.22 L
Post FEV1/FVC ratio: 84 %
Post FEV6/FVC ratio: 100 %
Pre FEV1/FVC ratio: 78 %
Pre FEV6/FVC Ratio: 100 %
RV % pred: 107 %
RV: 2.36 L
TLC % pred: 90 %
TLC: 6.84 L

## 2022-06-17 MED ORDER — ALBUTEROL SULFATE HFA 108 (90 BASE) MCG/ACT IN AERS: 2.0000 | INHALATION_SPRAY | Freq: Four times a day (QID) | RESPIRATORY_TRACT | 6 refills | Status: DC | PRN

## 2022-06-17 NOTE — Progress Notes (Signed)
PFT done today. 

## 2022-06-17 NOTE — Progress Notes (Signed)
Synopsis: Referred in October 2023 for dyspnea that occurs with laughing and singing.  Had previously been evaluated by otolaryngology who felt that he had reflux causing laryngeal irritation, recommended PPI therapy but unfortunately he did not start this.  Subjective:   PATIENT ID: Johnathan Bailey GENDER: male DOB: 1971/03/20, MRN: [id number]   HPI  Chief Complaint  Patient presents with   Follow-up    F/U after PFT. States he has not noticed a change in his symptoms.    Johnathan Bailey says that the omeprazole really didn't make that much of a difference in his breathing.  He continues to have episodes of shortness of breath or feeling that his breathing is being cut off in his throat when he sings or laughs.  He also says that he has some "tiredness" when he exerts himself like carrying a table upstairs or other heavy lifting at work.  He denies chest pain, cough, or mucus production.  No fevers or chills.  He again recounts a story from when he was a child when he lived in [place] and he was exposed to cold weather and he said he "almost died".  He was admitted to a hospital where he was treated for several days and then released.  His mother described this as an asthma attack.  Today he tells me a story about how his cousin died 3 years ago when she was receiving dental treatment.  She had a neck infection.  She he said that she had severe choking and they were unable to establish an airway and she died.  He says that ever since then he has had this problem where he feels like he is choking or that he cannot get an air.  He said that the stomach pills   Past Medical History:  Diagnosis Date   Fatty liver    Hyperlipidemia      Review of Systems  Constitutional:  Negative for chills, fever, malaise/fatigue and weight loss.  HENT:  Negative for congestion, sinus pain and sore throat.   Respiratory:  Positive for shortness of breath. Negative for cough and sputum production.    Cardiovascular:  Negative for chest pain and leg swelling.      Objective:  Physical Exam   Vitals:   06/17/22 0946  BP: 118/78  Pulse: 78  SpO2: 97%  Weight: 261 lb (118.4 kg)  Height: '6\' 1"'$  (1.854 m)    Gen: well appearing HENT: OP clear, neck supple PULM: CTA B, normal effort  CV: RRR, no mgr GI: BS+, soft, nontender Derm: no cyanosis or rash Psyche: normal mood and affect    CBC    Component Value Date/Time   WBC 7.6 04/02/2022 0957   RBC 5.27 04/02/2022 0957   HGB 15.5 04/02/2022 0957   HCT 45.8 04/02/2022 0957   PLT 308.0 04/02/2022 0957   MCV 86.9 04/02/2022 0957   MCHC 33.8 04/02/2022 0957   RDW 12.6 04/02/2022 0957     Chest imaging: September 2023 two-view chest x-ray images independently reviewed showing nonspecific interstitial changes bilaterally upper lobe predominant September 2023 lung windows personally reviewed showing no significant pulmonary parenchymal disease, nonspecific septal interstitial changes which are minor.  PFT: June 17, 2022 full pulmonary function testing ratio 78%, FVC 4.22 L 75% predicted, total lung capacity 6.84 L 90% predicted, DLCO 38.67 120% predicted (not adjusted for the patient's hemoglobin of 15.5 gm/dL)  Labs: September 2023 hemoglobin 15.5 g/dL  Path:  Echo:  Heart Catheterization:  Assessment & Plan:   Vocal cord dysfunction - Plan: Ambulatory referral to ENT  Abnormal CXR - Plan: CT Chest High Resolution  Dyspnea, unspecified type - Plan: Myocardial Perfusion Imaging  Discussion: 51 year old male with episodic shortness of breath: Some with exertion, some with strenuous use of his voice.  I think the 2 are not related.  There is little objective evidence that he has any evidence of lung disease.  Pulmonary function testing today is normal.  However, some chest x-rays have been interpreted as having upper lobe predominant scarring so I still think it is important to get a high-resolution  CT scan of the chest to rule out any sort of underlying lung disease.  That has yet to happen.  At age 36 with exertional dyspnea I think it is wise for him to have a cardiac stress test, will order that today.  However, I think the episodic dyspnea is likely vocal cord dysfunction.  He says that this has been happening ever since his cousin died from an unfortunate airway complication during it dental procedure 3 years ago.  I think the best approach would be for him to see a comprehensive voice center at [facility] where they can help him with vocal cord dysfunction.  Plan: Vocal cord dysfunction: Read the information sheet I gave you today I will refer you to Dr. Ernestine Conrad at [facility] for further evaluation and the voice center  Shortness of breath: We will get a high-resolution CT scan of the chest to make sure there is nothing else going on Use albuterol 2 puffs as needed for shortness of breath and let me know if this helps We will get a cardiac stress test, treadmill test  At this time we will see you back on an as-needed basis, please let me know if I can be of help and we will reschedule another appointment.   Immunizations: Immunization History  Administered Date(s) Administered   Influenza Inj Mdck Quad Pf 05/13/2018   Influenza Whole 05/26/2019   Influenza-Unspecified 05/27/2017, 05/05/2021   PFIZER(Purple Top)SARS-COV-2 Vaccination 10/09/2019, 10/30/2019   Tdap 07/27/2010, 02/08/2015     Current Outpatient Medications:    albuterol (VENTOLIN HFA) 108 (90 Base) MCG/ACT inhaler, Inhale 2 puffs into the lungs every 6 (six) hours as needed for wheezing or shortness of breath., Disp: 8 g, Rfl: 6   fenofibrate (TRICOR) 145 MG tablet, TAKE 1 TABLET(145 MG) BY MOUTH DAILY, Disp: 90 tablet, Rfl: 3   omega-3 acid ethyl esters (LOVAZA) 1 g capsule, Take by mouth daily., Disp: , Rfl:

## 2022-06-17 NOTE — Patient Instructions (Signed)
Vocal cord dysfunction: Read the information sheet I gave you today I will refer you to Dr. Ernestine Conrad at [facility] for further evaluation and the voice center  Shortness of breath: We will get a high-resolution CT scan of the chest to make sure there is nothing else going on Use albuterol 2 puffs as needed for shortness of breath and let me know if this helps We will get a cardiac stress test, treadmill test  At this time we will see you back on an as-needed basis, please let me know if I can be of help and we will reschedule another appointment.

## 2022-06-23 ENCOUNTER — Other Ambulatory Visit: Payer: Self-pay | Admitting: Pulmonary Disease

## 2022-06-23 DIAGNOSIS — R0600 Dyspnea, unspecified: Secondary | ICD-10-CM

## 2022-07-08 ENCOUNTER — Encounter (HOSPITAL_COMMUNITY): Payer: Self-pay | Admitting: Pulmonary Disease

## 2022-07-22 ENCOUNTER — Other Ambulatory Visit: Payer: Commercial Managed Care - PPO

## 2022-07-29 ENCOUNTER — Telehealth (HOSPITAL_COMMUNITY): Payer: Self-pay | Admitting: *Deleted

## 2022-07-29 ENCOUNTER — Encounter (HOSPITAL_COMMUNITY): Payer: Self-pay | Admitting: *Deleted

## 2022-07-29 NOTE — Telephone Encounter (Signed)
My Chart letter sent outlining instructions for upcoming stress test on 08/05/22.

## 2022-08-05 ENCOUNTER — Encounter (HOSPITAL_COMMUNITY): Payer: Commercial Managed Care - PPO

## 2022-08-06 ENCOUNTER — Other Ambulatory Visit (HOSPITAL_COMMUNITY): Payer: Self-pay | Admitting: Pulmonary Disease

## 2022-08-06 ENCOUNTER — Ambulatory Visit (HOSPITAL_COMMUNITY): Payer: Commercial Managed Care - PPO | Attending: Pulmonary Disease

## 2022-08-06 DIAGNOSIS — R0689 Other abnormalities of breathing: Secondary | ICD-10-CM | POA: Diagnosis present

## 2022-08-06 DIAGNOSIS — R0600 Dyspnea, unspecified: Secondary | ICD-10-CM | POA: Diagnosis present

## 2022-08-06 LAB — MYOCARDIAL PERFUSION IMAGING
Angina Index: 0
Duke Treadmill Score: 8
Estimated workload: 9.8
Estimated workload: 9.8
Exercise duration (min): 8 min
Exercise duration (min): 8 min
Exercise duration (sec): 16 s
Exercise duration (sec): 16 s
LV dias vol: 90 mL (ref 62–150)
LV sys vol: 37 mL
MPHR: 170 {beats}/min
MPHR: 170 {beats}/min
Nuc Stress EF: 59 %
Peak HR: 157 {beats}/min
Peak HR: 157 {beats}/min
Percent HR: 92 %
Percent HR: 92 %
Rest HR: 62 {beats}/min
Rest HR: 62 {beats}/min
Rest Nuclear Isotope Dose: 10.7 mCi
Rest Nuclear Isotope Dose: 10.7 mCi
SDS: 3
SRS: 0
SSS: 3
ST Depression (mm): 0 mm
Stress Nuclear Isotope Dose: 32.8 mCi
Stress Nuclear Isotope Dose: 32.8 mCi
TID: 0.97

## 2022-08-06 MED ORDER — TECHNETIUM TC 99M TETROFOSMIN IV KIT
32.8000 | PACK | Freq: Once | INTRAVENOUS | Status: AC | PRN
  Administered 2022-08-06: 32.8 via INTRAVENOUS

## 2022-08-06 MED ORDER — TECHNETIUM TC 99M TETROFOSMIN IV KIT
10.7000 | PACK | Freq: Once | INTRAVENOUS | Status: AC | PRN
  Administered 2022-08-06: 10.7 via INTRAVENOUS

## 2023-07-05 ENCOUNTER — Encounter: Payer: Self-pay | Admitting: Internal Medicine

## 2023-07-05 ENCOUNTER — Ambulatory Visit: Payer: Commercial Managed Care - PPO | Admitting: Internal Medicine

## 2023-07-05 VITALS — BP 130/80 | HR 85 | Temp 97.6°F | Ht 72.0 in | Wt 262.2 lb

## 2023-07-05 DIAGNOSIS — R1011 Right upper quadrant pain: Secondary | ICD-10-CM

## 2023-07-05 DIAGNOSIS — R109 Unspecified abdominal pain: Secondary | ICD-10-CM

## 2023-07-05 DIAGNOSIS — M549 Dorsalgia, unspecified: Secondary | ICD-10-CM | POA: Diagnosis not present

## 2023-07-05 LAB — POC URINALSYSI DIPSTICK (AUTOMATED)
Bilirubin, UA: NEGATIVE
Glucose, UA: NEGATIVE
Ketones, UA: NEGATIVE
Leukocytes, UA: NEGATIVE
Nitrite, UA: NEGATIVE
Protein, UA: NEGATIVE
Spec Grav, UA: 1.025 (ref 1.010–1.025)
Urobilinogen, UA: 2 U/dL — AB
pH, UA: 5 (ref 5.0–8.0)

## 2023-07-05 NOTE — Progress Notes (Signed)
[facility] [address] [place] [address] Dept: [phone] Dept Fax: [phone]  Acute Care Office Visit  Subjective:   Johnathan Bailey [birth year] 07/05/2023  Chief Complaint  Patient presents with   right side pain    Started weeks ago moves from front to back     HPI: Discussed the use of AI scribe software for clinical note transcription with the patient, who gave verbal consent to proceed.  History of Present Illness   The patient, with a history of fatty liver and gallstones, presents with persistent right upper quadrant and back pain. The abdominal discomfort has been present for several weeks and is described as a nagging sensation rather than acute pain. The back pain, located in the right flank and right thoracic area, has been present for approximately six to seven weeks. The patient reports that the pain is most noticeable upon waking and seems to lessen with activity throughout the day. He has not taken anything for pain OTC. They deny any associated fever, chills, urinary symptoms, gastrointestinal symptoms, or exacerbating factors. The patient acknowledges a lapse in diet and exercise habits, but does not attribute the pain to these factors.       The following portions of the patient's history were reviewed and updated as appropriate: past medical history, past surgical history, family history, social history, allergies, medications, and problem list.   Patient Active Problem List   Diagnosis Date Noted   Foot pain 10/01/2021   Change in voice 10/01/2021   Renal mass 04/03/2021   Fatty liver 04/03/2021   Calculus of gallbladder without cholecystitis without obstruction 03/20/2021   Right upper quadrant abdominal pain 03/13/2021   Class 2 obesity due to excess calories with body mass index (BMI) of 35.0 to 35.9 in adult 10/19/2019   Elevated triglycerides with high cholesterol 11/10/2018    Cervical strain 12/07/2017   Metatarsalgia of right foot 06/30/2017   Healthcare maintenance 06/30/2017   Past Medical History:  Diagnosis Date   Fatty liver    Hyperlipidemia    Past Surgical History:  Procedure Laterality Date   HERNIA REPAIR     KNEE ARTHROSCOPY W/ MENISCAL REPAIR Right    x2   Family History  Problem Relation Age of Onset   Colon cancer Neg Hx    Colon polyps Neg Hx    Esophageal cancer Neg Hx    Rectal cancer Neg Hx    Stomach cancer Neg Hx     Current Outpatient Medications:    albuterol (VENTOLIN HFA) 108 (90 Base) MCG/ACT inhaler, Inhale 2 puffs into the lungs every 6 (six) hours as needed for wheezing or shortness of breath. (Patient not taking: Reported on 07/05/2023), Disp: 8 g, Rfl: 6   fenofibrate (TRICOR) 145 MG tablet, TAKE 1 TABLET(145 MG) BY MOUTH DAILY (Patient not taking: Reported on 07/05/2023), Disp: 90 tablet, Rfl: 3   omega-3 acid ethyl esters (LOVAZA) 1 g capsule, Take by mouth daily. (Patient not taking: Reported on 07/05/2023), Disp: , Rfl:  No Known Allergies   ROS: A complete ROS was performed with pertinent positives/negatives noted in the HPI. The remainder of the ROS are negative.    Objective:   Today's Vitals   07/05/23 1451  BP: 130/80  Pulse: 85  Temp: 97.6 F (36.4 C)  TempSrc: Temporal  SpO2: 95%  Weight: 262 lb 3.2 oz (118.9 kg)  Height: 6' (1.829 m)    GENERAL: Well-appearing, in NAD. Well  nourished.  SKIN: Pink, warm and dry. No rash, lesion, ulceration, or ecchymoses.  NECK: Trachea midline. Full ROM w/o pain or tenderness. No lymphadenopathy.  RESPIRATORY: Chest wall symmetrical. Respirations even and non-labored. Breath sounds clear to auscultation bilaterally.  CARDIAC: S1, S2 present, regular rate and rhythm. Peripheral pulses 2+ bilaterally.  GI: Abdomen soft, mild-moderate tenderness with palpation to RUQ. No definitive murphy's sign. Normoactive bowel sounds. No rebound tenderness. No hepatomegaly or  splenomegaly. No CVA tenderness.  MSK: Muscle tone and strength appropriate for age. Joints w/o tenderness, redness, or swelling. EXTREMITIES: Without clubbing, cyanosis, or edema.  NEUROLOGIC: No motor or sensory deficits. Steady, even gait.  PSYCH/MENTAL STATUS: Alert, oriented x 3. Cooperative, appropriate mood and affect.    Results for orders placed or performed in visit on 07/05/23  POCT Urinalysis Dipstick (Automated)  Result Value Ref Range   Color, UA yellow    Clarity, UA clear    Glucose, UA Negative Negative   Bilirubin, UA neg    Ketones, UA neg    Spec Grav, UA 1.025 1.010 - 1.025   Blood, UA trace    pH, UA 5.0 5.0 - 8.0   Protein, UA Negative Negative   Urobilinogen, UA 2.0 (A) 0.2 or 1.0 E.U./dL   Nitrite, UA neg    Leukocytes, UA Negative Negative      Assessment & Plan:  Assessment and Plan    Right Upper Quadrant Pain - CBC, CMP, lipase - abdominal US  Lower Back and Flank Pain Chronic discomfort in the lower back, not worsened over time. No acute symptoms or triggers identified. Pain is not severe but is persistent and bothersome, particularly upon waking. - POCT urinalysis showed trace blood. Will send off for microscopic testing.  -Consider musculoskeletal etiology if other tests are normal.       Orders Placed This Encounter  Procedures   US Abdomen Complete    INSURANCE: [employer]  EPIC ORDER WT: 265 NO - ARE THERE ANY SPECIAL NEEDS (WHEELCHAIR, WALKER, OXYGEN) NO - GLUCOSE MONITOR, SPINAL CORD STIMULATOR, INJECTORS OR ANYTHING ELSE ON SKIN  PT AWARE TO BRING PHOTO ID & INS CARD PT AWARE OF $75 NO SHOW FEE/IF NOT CANCELLED 24 HOURS PRIOR.    Standing Status:   Future    Standing Expiration Date:   07/04/2024    Order Specific Question:   Reason for Exam (SYMPTOM  OR DIAGNOSIS REQUIRED)    Answer:   worsening right upper abdominal pain radiating to right upper back. Hx of gallstones and fatty liver. concern for cholecystitis    Order Specific  Question:   Preferred imaging location?    Answer:   [facility]   CBC w/Diff   Comp Met (CMET)   Lipase   Urinalysis, Routine w reflex microscopic   POCT Urinalysis Dipstick (Automated)   Lab Orders         CBC w/Diff         Comp Met (CMET)         Lipase         Urinalysis, Routine w reflex microscopic         POCT Urinalysis Dipstick (Automated)     No images are attached to the encounter or orders placed in the encounter.  Return if symptoms worsen or fail to improve.   Salvatore Decent, FNP

## 2023-07-05 NOTE — Patient Instructions (Signed)
I will be in touch once I receive lab results about further plan of care and testing

## 2023-07-06 LAB — CBC WITH DIFFERENTIAL/PLATELET
Basophils Absolute: 0.1 10*3/uL (ref 0.0–0.1)
Basophils Relative: 0.6 % (ref 0.0–3.0)
Eosinophils Absolute: 0.4 10*3/uL (ref 0.0–0.7)
Eosinophils Relative: 3.7 % (ref 0.0–5.0)
HCT: 47.8 % (ref 39.0–52.0)
Hemoglobin: 16.1 g/dL (ref 13.0–17.0)
Lymphocytes Relative: 24.2 % (ref 12.0–46.0)
Lymphs Abs: 2.5 10*3/uL (ref 0.7–4.0)
MCHC: 33.6 g/dL (ref 30.0–36.0)
MCV: 87.6 fL (ref 78.0–100.0)
Monocytes Absolute: 1 10*3/uL (ref 0.1–1.0)
Monocytes Relative: 9.9 % (ref 3.0–12.0)
Neutro Abs: 6.5 10*3/uL (ref 1.4–7.7)
Neutrophils Relative %: 61.6 % (ref 43.0–77.0)
Platelets: 337 10*3/uL (ref 150.0–400.0)
RBC: 5.46 Mil/uL (ref 4.22–5.81)
RDW: 12.5 % (ref 11.5–15.5)
WBC: 10.5 10*3/uL (ref 4.0–10.5)

## 2023-07-06 LAB — URINALYSIS, ROUTINE W REFLEX MICROSCOPIC
Bilirubin Urine: NEGATIVE
Hgb urine dipstick: NEGATIVE
Ketones, ur: NEGATIVE
Leukocytes,Ua: NEGATIVE
Nitrite: NEGATIVE
RBC / HPF: NONE SEEN (ref 0–?)
Specific Gravity, Urine: 1.025 (ref 1.000–1.030)
Total Protein, Urine: NEGATIVE
Urine Glucose: NEGATIVE
Urobilinogen, UA: 0.2 (ref 0.0–1.0)
WBC, UA: NONE SEEN (ref 0–?)
pH: 6 (ref 5.0–8.0)

## 2023-07-06 LAB — COMPREHENSIVE METABOLIC PANEL
ALT: 25 U/L (ref 0–53)
AST: 20 U/L (ref 0–37)
Albumin: 4.8 g/dL (ref 3.5–5.2)
Alkaline Phosphatase: 75 U/L (ref 39–117)
BUN: 19 mg/dL (ref 6–23)
CO2: 29 meq/L (ref 19–32)
Calcium: 9.6 mg/dL (ref 8.4–10.5)
Chloride: 99 meq/L (ref 96–112)
Creatinine, Ser: 0.83 mg/dL (ref 0.40–1.50)
GFR: 101.1 mL/min (ref >60.00)
Glucose, Bld: 78 mg/dL (ref 70–99)
Potassium: 3.9 meq/L (ref 3.5–5.1)
Sodium: 138 meq/L (ref 135–145)
Total Bilirubin: 1.2 mg/dL (ref 0.2–1.2)
Total Protein: 8 g/dL (ref 6.0–8.3)

## 2023-07-06 LAB — LIPASE: Lipase: 29 U/L (ref 11.0–59.0)

## 2023-07-07 ENCOUNTER — Other Ambulatory Visit: Payer: Self-pay | Admitting: Internal Medicine

## 2023-07-07 ENCOUNTER — Inpatient Hospital Stay
Admission: RE | Admit: 2023-07-07 | Discharge: 2023-07-07 | Discharge status: Home / Self Care | Payer: Commercial Managed Care - PPO | Source: Ambulatory Visit | Attending: Internal Medicine | Admitting: Internal Medicine

## 2023-07-07 DIAGNOSIS — R1011 Right upper quadrant pain: Secondary | ICD-10-CM

## 2023-07-07 DIAGNOSIS — K8020 Calculus of gallbladder without cholecystitis without obstruction: Secondary | ICD-10-CM

## 2023-07-08 ENCOUNTER — Telehealth: Payer: Self-pay | Admitting: Family Medicine

## 2023-07-08 NOTE — Telephone Encounter (Signed)
Pt called with concerns about the surgery center calling him to make an appt. He does not know what this is for. Please give him a call with the results from his [place]  and why he needs surgery.  [employer] [phone]

## 2023-07-08 NOTE — Telephone Encounter (Signed)
Patient informed of results, verbalized understanding.  Patient is requesting a copy of results for pickup

## 2023-07-09 ENCOUNTER — Telehealth: Payer: Self-pay | Admitting: Family Medicine

## 2023-07-09 NOTE — Telephone Encounter (Signed)
Explained to pt why gen surgery was contacting him to schedule an appt.Pt states he never received a call from our office and was caught off guard with the call from gen surgery. Clarification provided to the pt. Verbalizes understanding. No further questions.

## 2023-07-09 NOTE — Telephone Encounter (Signed)
Pt picked up his documents this morning, and also wants the nurse to give him a call back to discuss his lab results and scans. Call back  [phone]

## 2023-07-18 ENCOUNTER — Ambulatory Visit: Payer: Self-pay | Admitting: Surgery

## 2023-08-10 NOTE — Progress Notes (Addendum)
COVID Vaccine received:  []  No [x]  Yes Date of any COVID positive Test in last 90 days: no PCP - Nadene Rubins MD Cardiologist -   Chest x-ray -  EKG -   Stress Test - 08/06/22 Epic ECHO -  Cardiac Cath -   Bowel Prep - [x]  No  []   Yes ______  Pacemaker / ICD device [x]  No []  Yes   Spinal Cord Stimulator:[x]  No []  Yes       History of Sleep Apnea? [x]  No []  Yes   CPAP used?- [x]  No []  Yes    Does the patient monitor blood sugar?          [x]  No []  Yes  []  N/A  Patient has: [x]  NO Hx DM   []  Pre-DM                 []  DM1  []   DM2 Does patient have a [employer] or [employer]? []  No []  Yes   Fasting Blood Sugar Ranges-  Checks Blood Sugar _____ times a day  GLP1 agonist / usual dose - no GLP1 instructions:  SGLT-2 inhibitors / usual dose - no SGLT-2 instructions:   Blood Thinner / Instructions:no Aspirin Instructions:no  Comments:   Activity level: Patient is able to climb a flight of stairs without difficulty; [x]  No CP  [x]  No SOB, ___   Patient canperform ADLs without assistance.   Anesthesia review:   Patient denies shortness of breath, fever, cough and chest pain at PAT appointment.  Patient verbalized understanding and agreement to the Pre-Surgical Instructions that were given to them at this PAT appointment. Patient was also educated of the need to review these PAT instructions again prior to his/her surgery.I reviewed the appropriate phone numbers to call if they have any and questions or concerns.

## 2023-08-12 ENCOUNTER — Other Ambulatory Visit: Payer: Self-pay

## 2023-08-12 ENCOUNTER — Encounter (HOSPITAL_COMMUNITY)
Admission: RE | Admit: 2023-08-12 | Discharge: 2023-08-12 | Disposition: A | Discharge status: Home / Self Care | Payer: Commercial Managed Care - PPO | Source: Ambulatory Visit | Attending: Surgery | Admitting: Surgery

## 2023-08-12 ENCOUNTER — Encounter (HOSPITAL_COMMUNITY): Payer: Self-pay

## 2023-08-12 VITALS — BP 142/88 | HR 69 | Temp 97.9°F | Resp 16 | Ht 74.0 in | Wt 264.0 lb

## 2023-08-12 DIAGNOSIS — Z01812 Encounter for preprocedural laboratory examination: Secondary | ICD-10-CM | POA: Insufficient documentation

## 2023-08-12 DIAGNOSIS — Z01818 Encounter for other preprocedural examination: Secondary | ICD-10-CM | POA: Diagnosis present

## 2023-08-12 DIAGNOSIS — N2889 Other specified disorders of kidney and ureter: Secondary | ICD-10-CM | POA: Insufficient documentation

## 2023-08-12 LAB — CBC
HCT: 47.2 % (ref 39.0–52.0)
Hemoglobin: 15.8 g/dL (ref 13.0–17.0)
MCH: 29 pg (ref 26.0–34.0)
MCHC: 33.5 g/dL (ref 30.0–36.0)
MCV: 86.8 fL (ref 80.0–100.0)
Platelets: 285 10*3/uL (ref 150–400)
RBC: 5.44 MIL/uL (ref 4.22–5.81)
RDW: 12.2 % (ref 11.5–15.5)
WBC: 7.2 10*3/uL (ref 4.0–10.5)
nRBC: 0 % (ref 0.0–0.2)

## 2023-08-12 LAB — BASIC METABOLIC PANEL
Anion gap: 7 (ref 5–15)
BUN: 21 mg/dL — ABNORMAL HIGH (ref 6–20)
CO2: 24 mmol/L (ref 22–32)
Calcium: 9 mg/dL (ref 8.9–10.3)
Chloride: 103 mmol/L (ref 98–111)
Creatinine, Ser: 0.74 mg/dL (ref 0.61–1.24)
GFR, Estimated: >60 mL/min (ref >60)
Glucose, Bld: 114 mg/dL — ABNORMAL HIGH (ref 70–99)
Potassium: 4.2 mmol/L (ref 3.5–5.1)
Sodium: 134 mmol/L — ABNORMAL LOW (ref 135–145)

## 2023-08-17 ENCOUNTER — Encounter (HOSPITAL_COMMUNITY)
Admission: RE | Disposition: A | Discharge status: Home / Self Care | Payer: Self-pay | Source: Home / Self Care | Attending: Surgery

## 2023-08-17 ENCOUNTER — Ambulatory Visit (HOSPITAL_COMMUNITY)
Admission: RE | Admit: 2023-08-17 | Discharge: 2023-08-17 | Disposition: A | Discharge status: Home / Self Care | Payer: Commercial Managed Care - PPO | Attending: Surgery | Admitting: Surgery

## 2023-08-17 ENCOUNTER — Encounter (HOSPITAL_COMMUNITY): Payer: Self-pay | Admitting: Surgery

## 2023-08-17 ENCOUNTER — Ambulatory Visit (HOSPITAL_BASED_OUTPATIENT_CLINIC_OR_DEPARTMENT_OTHER): Payer: Commercial Managed Care - PPO | Admitting: Anesthesiology

## 2023-08-17 ENCOUNTER — Other Ambulatory Visit: Payer: Self-pay

## 2023-08-17 ENCOUNTER — Ambulatory Visit (HOSPITAL_COMMUNITY): Payer: Commercial Managed Care - PPO | Admitting: Anesthesiology

## 2023-08-17 DIAGNOSIS — K819 Cholecystitis, unspecified: Secondary | ICD-10-CM | POA: Diagnosis not present

## 2023-08-17 DIAGNOSIS — Z6833 Body mass index (BMI) 33.0-33.9, adult: Secondary | ICD-10-CM | POA: Insufficient documentation

## 2023-08-17 DIAGNOSIS — K8010 Calculus of gallbladder with chronic cholecystitis without obstruction: Secondary | ICD-10-CM | POA: Diagnosis present

## 2023-08-17 DIAGNOSIS — E66813 Obesity, class 3: Secondary | ICD-10-CM | POA: Insufficient documentation

## 2023-08-17 HISTORY — PX: CHOLECYSTECTOMY: SHX55

## 2023-08-17 SURGERY — LAPAROSCOPIC CHOLECYSTECTOMY
Anesthesia: General | Site: Abdomen

## 2023-08-17 MED ORDER — FENTANYL CITRATE (PF) 100 MCG/2ML IJ SOLN
INTRAMUSCULAR | Status: AC
  Filled 2023-08-17: qty 2

## 2023-08-17 MED ORDER — MEPERIDINE HCL 50 MG/ML IJ SOLN
6.2500 mg | INTRAMUSCULAR | Status: DC | PRN
Start: 2023-08-17 — End: 2023-08-17

## 2023-08-17 MED ORDER — 0.9 % SODIUM CHLORIDE (POUR BTL) OPTIME
TOPICAL | Status: DC | PRN
  Administered 2023-08-17: 1000 mL

## 2023-08-17 MED ORDER — BUPIVACAINE-EPINEPHRINE 0.25% -1:200000 IJ SOLN
INTRAMUSCULAR | Status: AC
  Filled 2023-08-17: qty 1

## 2023-08-17 MED ORDER — ONDANSETRON HCL 4 MG/2ML IJ SOLN: 4.0000 mg | Freq: Once | INTRAMUSCULAR | Status: DC | PRN

## 2023-08-17 MED ORDER — LIDOCAINE 2% (20 MG/ML) 5 ML SYRINGE
INTRAMUSCULAR | Status: DC | PRN
  Administered 2023-08-17: 50 mg via INTRAVENOUS

## 2023-08-17 MED ORDER — SUGAMMADEX SODIUM 200 MG/2ML IV SOLN
INTRAVENOUS | Status: DC | PRN
  Administered 2023-08-17: 300 mg via INTRAVENOUS

## 2023-08-17 MED ORDER — ACETAMINOPHEN 325 MG PO TABS: 325.0000 mg | ORAL_TABLET | ORAL | Status: DC | PRN

## 2023-08-17 MED ORDER — ONDANSETRON HCL 4 MG/2ML IJ SOLN
INTRAMUSCULAR | Status: DC | PRN
  Administered 2023-08-17: 4 mg via INTRAVENOUS

## 2023-08-17 MED ORDER — ROCURONIUM BROMIDE 10 MG/ML (PF) SYRINGE
PREFILLED_SYRINGE | INTRAVENOUS | Status: DC | PRN
  Administered 2023-08-17: 60 mg via INTRAVENOUS

## 2023-08-17 MED ORDER — MIDAZOLAM HCL 2 MG/2ML IJ SOLN
INTRAMUSCULAR | Status: AC
  Filled 2023-08-17: qty 2

## 2023-08-17 MED ORDER — PROPOFOL 10 MG/ML IV BOLUS
INTRAVENOUS | Status: DC | PRN
  Administered 2023-08-17: 200 mg via INTRAVENOUS

## 2023-08-17 MED ORDER — ACETAMINOPHEN 160 MG/5ML PO SOLN: 325.0000 mg | ORAL | Status: DC | PRN

## 2023-08-17 MED ORDER — CEFAZOLIN SODIUM-DEXTROSE 2-4 GM/100ML-% IV SOLN
2.0000 g | INTRAVENOUS | Status: AC
  Administered 2023-08-17: 2 g via INTRAVENOUS
  Filled 2023-08-17: qty 100

## 2023-08-17 MED ORDER — LACTATED RINGERS IV SOLN: INTRAVENOUS | Status: DC

## 2023-08-17 MED ORDER — ORAL CARE MOUTH RINSE: 15.0000 mL | Freq: Once | OROMUCOSAL | Status: AC

## 2023-08-17 MED ORDER — FENTANYL CITRATE PF 50 MCG/ML IJ SOSY: 25.0000 ug | PREFILLED_SYRINGE | INTRAMUSCULAR | Status: DC | PRN

## 2023-08-17 MED ORDER — OXYCODONE-ACETAMINOPHEN 5-325 MG PO TABS: 1.0000 | ORAL_TABLET | ORAL | 0 refills | Status: AC | PRN

## 2023-08-17 MED ORDER — GABAPENTIN 300 MG PO CAPS
300.0000 mg | ORAL_CAPSULE | ORAL | Status: AC
Start: 2023-08-17 — End: 2023-08-17
  Administered 2023-08-17: 300 mg via ORAL
  Filled 2023-08-17: qty 1

## 2023-08-17 MED ORDER — DEXAMETHASONE SODIUM PHOSPHATE 10 MG/ML IJ SOLN
INTRAMUSCULAR | Status: DC | PRN
  Administered 2023-08-17: 10 mg via INTRAVENOUS

## 2023-08-17 MED ORDER — KETOROLAC TROMETHAMINE 15 MG/ML IJ SOLN
15.0000 mg | INTRAMUSCULAR | Status: AC
Start: 2023-08-17 — End: 2023-08-17
  Administered 2023-08-17: 15 mg via INTRAVENOUS

## 2023-08-17 MED ORDER — OXYCODONE HCL 5 MG/5ML PO SOLN
5.0000 mg | Freq: Once | ORAL | Status: DC | PRN
Start: 2023-08-17 — End: 2023-08-17

## 2023-08-17 MED ORDER — OXYCODONE HCL 5 MG PO TABS: 5.0000 mg | ORAL_TABLET | Freq: Once | ORAL | Status: DC | PRN

## 2023-08-17 MED ORDER — CHLORHEXIDINE GLUCONATE 0.12 % MT SOLN
15.0000 mL | Freq: Once | OROMUCOSAL | Status: AC
  Administered 2023-08-17: 15 mL via OROMUCOSAL

## 2023-08-17 MED ORDER — PROPOFOL 10 MG/ML IV BOLUS
INTRAVENOUS | Status: AC
  Filled 2023-08-17: qty 20

## 2023-08-17 MED ORDER — BUPIVACAINE-EPINEPHRINE 0.25% -1:200000 IJ SOLN
INTRAMUSCULAR | Status: DC | PRN
  Administered 2023-08-17: 30 mL

## 2023-08-17 MED ORDER — FENTANYL CITRATE (PF) 100 MCG/2ML IJ SOLN
INTRAMUSCULAR | Status: DC | PRN
  Administered 2023-08-17 (×2): 100 ug via INTRAVENOUS

## 2023-08-17 MED ORDER — CHLORHEXIDINE GLUCONATE CLOTH 2 % EX PADS: 6.0000 | MEDICATED_PAD | Freq: Once | CUTANEOUS | Status: DC

## 2023-08-17 MED ORDER — GABAPENTIN 300 MG PO CAPS
ORAL_CAPSULE | ORAL | Status: AC
  Filled 2023-08-17: qty 1

## 2023-08-17 MED ORDER — ACETAMINOPHEN 500 MG PO TABS
1000.0000 mg | ORAL_TABLET | ORAL | Status: AC
  Administered 2023-08-17: 1000 mg via ORAL
  Filled 2023-08-17: qty 2

## 2023-08-17 MED ORDER — MIDAZOLAM HCL 2 MG/2ML IJ SOLN
INTRAMUSCULAR | Status: DC | PRN
  Administered 2023-08-17: 2 mg via INTRAVENOUS

## 2023-08-17 SURGICAL SUPPLY — 37 items
APPLIER CLIP 5 13 M/L LIGAMAX5 (MISCELLANEOUS) ×1
APPLIER CLIP ROT 10 11.4 M/L (STAPLE) ×1
BAG COUNTER SPONGE SURGICOUNT (BAG) IMPLANT
CABLE HIGH FREQUENCY MONO STRZ (ELECTRODE) ×1 IMPLANT
CATH URETL OPEN 5X70 (CATHETERS) IMPLANT
CHLORAPREP W/TINT 26 (MISCELLANEOUS) ×1 IMPLANT
CLIP APPLIE 5 13 M/L LIGAMAX5 (MISCELLANEOUS) IMPLANT
CLIP APPLIE ROT 10 11.4 M/L (STAPLE) ×1 IMPLANT
COVER MAYO STAND XLG (MISCELLANEOUS) ×1 IMPLANT
COVER SURGICAL LIGHT HANDLE (MISCELLANEOUS) ×1 IMPLANT
DERMABOND ADVANCED .7 DNX12 (GAUZE/BANDAGES/DRESSINGS) ×1 IMPLANT
DRAPE C-ARM 42X120 X-RAY (DRAPES) IMPLANT
ELECT REM PT RETURN 15FT ADLT (MISCELLANEOUS) ×1 IMPLANT
ENDOLOOP SUT PDS II 0 18 (SUTURE) ×1 IMPLANT
GLOVE BIO SURGEON STRL SZ7.5 (GLOVE) ×1 IMPLANT
GLOVE INDICATOR 8.0 STRL GRN (GLOVE) ×1 IMPLANT
GOWN STRL REUS W/ TWL XL LVL3 (GOWN DISPOSABLE) ×2 IMPLANT
GRASPER SUT TROCAR 14GX15 (MISCELLANEOUS) IMPLANT
HEMOSTAT SNOW SURGICEL 2X4 (HEMOSTASIS) IMPLANT
IRRIG SUCT STRYKERFLOW 2 WTIP (MISCELLANEOUS) ×1
IRRIGATION SUCT STRKRFLW 2 WTP (MISCELLANEOUS) ×1 IMPLANT
IV CATH 14GX2 1/4 (CATHETERS) ×1 IMPLANT
KIT BASIN OR (CUSTOM PROCEDURE TRAY) ×1 IMPLANT
KIT TURNOVER KIT A (KITS) IMPLANT
NDL INSUFFLATION 14GA 120MM (NEEDLE) ×1 IMPLANT
NEEDLE INSUFFLATION 14GA 120MM (NEEDLE) ×1
POUCH RETRIEVAL ECOSAC 10 (ENDOMECHANICALS) ×1 IMPLANT
SCISSORS LAP 5X35 DISP (ENDOMECHANICALS) ×1 IMPLANT
SET TUBE SMOKE EVAC HIGH FLOW (TUBING) ×1 IMPLANT
SLEEVE Z-THREAD 5X100MM (TROCAR) ×2 IMPLANT
SPIKE FLUID TRANSFER (MISCELLANEOUS) ×1 IMPLANT
STOPCOCK 4 WAY LG BORE MALE ST (IV SETS) IMPLANT
SUT MNCRL AB 4-0 PS2 18 (SUTURE) ×1 IMPLANT
TOWEL OR 17X26 10 PK STRL BLUE (TOWEL DISPOSABLE) ×1 IMPLANT
TRAY LAPAROSCOPIC (CUSTOM PROCEDURE TRAY) ×1 IMPLANT
TROCAR ADV FIXATION 12X100MM (TROCAR) ×1 IMPLANT
TROCAR Z-THREAD OPTICAL 5X100M (TROCAR) ×1 IMPLANT

## 2023-08-17 NOTE — Transfer of Care (Signed)
Immediate Anesthesia Transfer of Care Note  Patient: Johnathan Bailey  Procedure(s) Performed: LAPAROSCOPIC CHOLECYSTECTOMY (Abdomen)  Patient Location: PACU  Anesthesia Type:General  Level of Consciousness: awake and alert   Airway & Oxygen Therapy: Face Mask, Patent   Post-op Assessment: Report given to RN and Post -op Vital signs reviewed and stable  Post vital signs: Reviewed and stable  Last Vitals:  Vitals Value Taken Time  BP 143/71 08/17/23 1512  Temp    Pulse 92 08/17/23 1515  Resp 19 08/17/23 1515  SpO2 97 % 08/17/23 1515  Vitals shown include unfiled device data.  Last Pain:  Vitals:   08/17/23 1355  TempSrc:   PainSc: 0-No pain         Complications: No notable events documented.

## 2023-08-17 NOTE — Anesthesia Procedure Notes (Signed)
Procedure Name: Intubation Date/Time: 08/17/2023 2:17 PM  Performed by: Carloyn Manner, CRNAPre-anesthesia Checklist: Patient identified, Emergency Drugs available, Suction available, Patient being monitored and Timeout performed Patient Re-evaluated:Patient Re-evaluated prior to induction Oxygen Delivery Method: Circle system utilized Preoxygenation: Pre-oxygenation with 100% oxygen Induction Type: IV induction Ventilation: Mask ventilation without difficulty Laryngoscope Size: Glidescope and 4 Grade View: Grade I Tube type: Oral Tube size: 7.5 mm Number of attempts: 1 Airway Equipment and Method: Stylet Placement Confirmation: ETT inserted through vocal cords under direct vision, positive ETCO2 and breath sounds checked- equal and bilateral Secured at: 22 cm Tube secured with: Tape Dental Injury: Teeth and Oropharynx as per pre-operative assessment

## 2023-08-17 NOTE — Anesthesia Preprocedure Evaluation (Signed)
Anesthesia Evaluation  Patient identified by MRN, date of birth, ID band Patient awake    Reviewed: Allergy & Precautions, H&P , NPO status , Patient's Chart, lab work & pertinent test results  Airway Mallampati: I  TM Distance: >3 FB Neck ROM: Full    Dental no notable dental hx. (+) Teeth Intact, Dental Advisory Given   Pulmonary neg pulmonary ROS   Pulmonary exam normal breath sounds clear to auscultation       Cardiovascular negative cardio ROS  Rhythm:Regular Rate:Normal     Neuro/Psych negative neurological ROS  negative psych ROS   GI/Hepatic negative GI ROS, Neg liver ROS,,,  Endo/Other    Class 3 obesity  Renal/GU negative Renal ROS  negative genitourinary   Musculoskeletal   Abdominal   Peds  Hematology negative hematology ROS (+)   Anesthesia Other Findings   Reproductive/Obstetrics negative OB ROS                             Anesthesia Physical Anesthesia Plan  ASA: 2  Anesthesia Plan: General   Post-op Pain Management: Tylenol PO (pre-op)*   Induction: Intravenous  PONV Risk Score and Plan: 3 and Ondansetron, Dexamethasone and Midazolam  Airway Management Planned: Oral ETT  Additional Equipment:   Intra-op Plan:   Post-operative Plan: Extubation in OR  Informed Consent: I have reviewed the patients History and Physical, chart, labs and discussed the procedure including the risks, benefits and alternatives for the proposed anesthesia with the patient or authorized representative who has indicated his/her understanding and acceptance.     Dental advisory given  Plan Discussed with: CRNA  Anesthesia Plan Comments:        Anesthesia Quick Evaluation

## 2023-08-17 NOTE — Op Note (Signed)
Patient: Johnathan Bailey (08/11/1970, [id number])  Date of Surgery: 08/17/2023  Preoperative Diagnosis: Cholecystitis   Postoperative Diagnosis: Cholecystitis   Surgical Procedure: LAPAROSCOPIC CHOLECYSTECTOMY:    Operative Team Members:  Surgeons and Role:    * Zeddie Njie, Hyman Hopes, MD - Primary   Anesthesiologist: Gaynelle Adu, MD CRNA: Carloyn Manner, CRNA   Anesthesia: General   Fluids:  Total I/O In: 100 [IV Piggyback:100] Out: 5 [Blood:5]  Complications: * No complications entered in OR log *  Drains:  none   Specimen:  ID Type Source Tests Collected by Time Destination  1 : Gallbladder Tissue PATH Gallbladder SURGICAL PATHOLOGY Obert Espindola, Hyman Hopes, MD 08/17/2023 1459      Disposition:  PACU - hemodynamically stable.  Plan of Care: Discharge to home after PACU    Indications for Procedure: Johnathan Bailey is a 53 y.o. male who presented with abdominal pain.  History, physical and imaging was concerning for cholecystitis.  Laparoscopic cholecystectomy was recommended for the patient.  The procedure itself, as well as the risks, benefits and alternatives were discussed with the patient.  Risks discussed included but were not limited to the risk of infection, bleeding, damage to nearby structures, need to convert to open procedure, incisional hernia, bile leak, common bile duct injury and the need for additional procedures or surgeries.  With this discussion complete and all questions answered the patient granted consent to proceed.  Findings: Inflamed gallbladder  Infection status: Patient: Johnathan Bailey Patient Elective Case Case: Elective Infection Present At Time Of Surgery (PATOS):  Some spillage of bile   Description of Procedure:   On the date stated above, the patient was taken to the operating room suite and placed in supine positioning.  Sequential compression devices were placed on the lower extremities to prevent blood clots.  General  endotracheal anesthesia was induced. Preoperative antibiotics were given.  The patient's abdomen was prepped and draped in the usual sterile fashion.  A time-out was completed verifying the correct patient, procedure, positioning and equipment needed for the case.  We began by anesthetizing the skin with local anesthetic and then making a 12 mm incision just below the umbilicus.  We dissected through the subcutaneous tissues to the fascia.  The fascia was grasped and elevated using a Kocher clamp.  A Veress needle was inserted into the abdomen and the abdomen was insufflated to 15 mmHg.  A 12 mm trocar was inserted in this position under optical guidance and then the abdomen was inspected.  There was no trauma to the underlying viscera with initial trocar placement.  Any abnormal findings, other than inflammation in the right upper quadrant, are listed above in the findings section.  Three additional trocars were placed, one 5 mm trocar in the subxiphoid position, one 5 mm trocar in the midline epigastric area and one 5mm trocar in the right upper quadrant subcostally.  These were placed under direct vision without any trauma to the underlying viscera.    The patient was then placed in head up, left side down positioning.  The gallbladder was identified and dissected free from its attachments to the omentum allowing the duodenum to fall away.  The infundibulum of the gallbladder was dissected free working laterally to medially.  The cystic duct and cystic artery were dissected free from surrounding connective tissue.  The infundibulum of the gallbladder was dissected off the cystic plate.  A critical view of safety was obtained with the cystic duct and cystic artery being  cleared of connective tissues and clearly the only two structures entering into the gallbladder with the liver clearly visible behind.  Clips were then applied to the cystic duct and cystic artery and then these structures were divided.  A PDS  endoloop was placed on the cystic duct stump. The gallbladder was dissected off the cystic plate, placed in an endocatch bag and removed from the 12 mm port site.  The clips were inspected and appeared effective.  The cystic plate was inspected and hemostasis was obtained using electrocautery.  A suction irrigator was used to clean the operative field.  Attention was turned to closure.  The 12 mm subxiphoid port site was closed using a 0-vicryl suture on a fascial suture passer.  The abdomen was desufflated.  The skin was closed using 4-0 monocryl and dermabond.  All sponge and needle counts were correct at the conclusion of the case.    Ivar Drape, MD General, Bariatric, & Minimally Invasive Surgery [facility], [place]

## 2023-08-17 NOTE — Discharge Instructions (Signed)
 CHOLECYSTECTOMY POST OPERATIVE INSTRUCTIONS  Thinking Clearly  The anesthesia may cause you to feel different for 1 or 2 days. Do not drive, drink alcohol, or make any big decisions for at least 2 days.  Nutrition When you wake up, you will be able to drink small amounts of liquid. If you do not feel sick, you can slowly advance your diet to regular foods. Continue to drink lots of fluids, usually about 8 to 10 glasses per day. Eat a high-fiber diet so you don't strain during bowel movements. High-Fiber Foods Foods high in fiber include beans, bran cereals and whole-grain breads, peas, dried fruit (figs, apricots, and dates), raspberries, blackberries, strawberries, sweet corn, broccoli, baked potatoes with skin, plums, pears, apples, greens, and nuts. Activity Slowly increase your activity. Be sure to get up and walk every hour or so to prevent blood clots. No heavy lifting or strenuous activity for 4 weeks following surgery to prevent hernias at your incision sites It is normal to feel tired. You may need more sleep than usual.  Get your rest but make sure to get up and move around frequently to prevent blood clots and pneumonia.  Work and Return to School You can go back to work when you feel well enough. Discuss the timing with your surgeon. You can usually go back to school or work 1 week after an operation. If your work requires heavy lifting or strenuous activity you need to be placed on light duty for 4 weeks following surgery. You can return to gym class, sports or other physical activities 4 weeks after surgery.  Wound Care Always wash your hands before and after touching near your incision site. Do not soak in a bathtub until cleared at your follow up appointment. You may take a shower 24 hours after surgery. A small amount of drainage from the incision is normal. If the drainage is thick and yellow or the site is red, you may have an infection, so call your surgeon. If you  have a drain in one of your incisions, it will be taken out in office when the drainage stops. Steri-Strips will fall off in 7 to 10 days or they will be removed during your first office visit. If you have dermabond glue covering over the incision, allow the glue to flake off on its own. Avoid wearing tight or rough clothing. It may rub your incisions and make it harder for them to heal. Protect the new skin, especially from the sun. The sun can burn and cause darker scarring. Your scar will heal in about 4 to 6 weeks and will become softer and continue to fade over the next year.  The cosmetic appearance of the incisions will improve over the course of the first year after surgery. Sensation around your incision will return in a few weeks or months.  Bowel Movements After intestinal surgery, you may have loose watery stools for several days. If watery diarrhea lasts longer than 3 days, contact your surgeon. Pain medication (narcotics) can cause constipation. Increase the fiber in your diet with high-fiber foods if you are constipated. You can take an over the counter stool softener like Colace to avoid constipation.  Additional over the counter medications can also be used if Colace isn't sufficient (for example, Milk of Magnesia or Miralax).  Pain The amount of pain is different for each person. Some people need only 1 to 3 doses of pain control medication, while others need more. Take alternating doses of tylenol   and ibuprofen around the clock for the first five days following surgery.  This will provide a baseline of pain control and help with inflammation.  Take the narcotic pain medication in addition if needed for severe pain.  Contact Your Surgeon at [phone], if you have: Pain in your right upper abdomen like a gallbladder attack. Pain that will not go away Pain that gets worse A fever of more than 101F (38.3C) Repeated vomiting Swelling, redness, bleeding, or bad-smelling  drainage from your wound site Strong abdominal pain No bowel movement or unable to pass gas for 3 days Watery diarrhea lasting longer than 3 days  Pain Control The goal of pain control is to minimize pain, keep you moving and help you heal. Your surgical team will work with you on your pain plan. Most often a combination of therapies and medications are used to control your pain. You may also be given medication (local anesthetic) at the surgical site. This may help control your pain for several days. Extreme pain puts extra stress on your body at a time when your body needs to focus on healing. Do not wait until your pain has reached a level "10" or is unbearable before telling your doctor or nurse. It is much easier to control pain before it becomes severe. Following a laparoscopic procedure, pain is sometimes felt in the shoulder. This is due to the gas inserted into your abdomen during the procedure. Moving and walking helps to decrease the gas and the right shoulder pain.  Use the guide below for ways to manage your post-operative pain. Learn more by going to facs.org/safepaincontrol.  How Intense Is My Pain Common Therapies to Feel Better       I hardly notice my pain, and it does not interfere with my activities.  I notice my pain and it distracts me, but I can still do activities (sitting up, walking, standing).  Non-Medication Therapies  Ice (in a bag, applied over clothing at the surgical site), elevation, rest, meditation, massage, distraction (music, TV, play) walking and mild exercise Splinting the abdomen with pillows +  Non-Opioid Medications Acetaminophen (Tylenol) Non-steroidal anti-inflammatory drugs (NSAIDS) Aspirin, Ibuprofen (Motrin, Advil) Naproxen (Aleve) Take these as needed, when you feel pain. Both acetaminophen and NSAIDs help to decrease pain and swelling (inflammation).      My pain is hard to ignore and is more noticeable even when I rest.  My  pain interferes with my usual activities.  Non-Medication Therapies  +  Non-Opioid medications  Take on a regular schedule (around-the-clock) instead of as needed. (For example, Tylenol every 6 hours at 9:00 am, 3:00 pm, 9:00 pm, 3:00 am and Motrin every 6 hours at 12:00 am, 6:00 am, 12:00 pm, 6:00 pm)         I am focused on my pain, and I am not doing my daily activities.  I am groaning in pain, and I cannot sleep. I am unable to do anything.  My pain is as bad as it could be, and nothing else matters.  Non-Medication Therapies  +  Around-the-Clock Non-Opioid Medications  +  Short-acting opioids  Opioids should be used with other medications to manage severe pain. Opioids block pain and give a feeling of euphoria (feel high). Addiction, a serious side effect of opioids, is rare with short-term (a few days) use.  Examples of short-acting opioids include: Tramadol (Ultram), Hydrocodone (Norco, Vicodin), Hydromorphone (Dilaudid), Oxycodone (Oxycontin)     The above directions have been adapted from   the American College of [employer] Surgical Patient Education Program.  Please refer to the ACS website if needed: https://www.facs.org/-/media/files/education/patient-ed/cholesys.ashx.   Norvell Caswell, MD [facility] [address], [address], [place], [place]  [address] ?  [address] [address], [place], [place]   [address] [phone] ? [phone] ? FAX [phone] Web site: [email or web address]  

## 2023-08-17 NOTE — H&P (Signed)
Admitting Physician: Hyman Hopes Marrissa Dai  Service: General surgery  CC: abdominal pain  Subjective   HPI: Johnathan Bailey Johnathan Bailey is an 53 y.o. male who is here for cholecystectomy  Past Medical History:  Diagnosis Date   Fatty liver    Hyperlipidemia     Past Surgical History:  Procedure Laterality Date   HERNIA REPAIR     KNEE ARTHROSCOPY W/ MENISCAL REPAIR Right    x2    Family History  Problem Relation Age of Onset   Colon cancer Neg Hx    Colon polyps Neg Hx    Esophageal cancer Neg Hx    Rectal cancer Neg Hx    Stomach cancer Neg Hx     Social:  reports that he has never smoked. He has never used smokeless tobacco. He reports current alcohol use. He reports that he does not use drugs.  Allergies: No Known Allergies  Medications: No current outpatient medications  ROS - all of the below systems have been reviewed with the patient and positives are indicated with bold text General: chills, fever or night sweats Eyes: blurry vision or double vision ENT: epistaxis or sore throat Allergy/Immunology: itchy/watery eyes or nasal congestion Hematologic/Lymphatic: bleeding problems, blood clots or swollen lymph nodes Endocrine: temperature intolerance or unexpected weight changes Breast: new or changing breast lumps or nipple discharge Resp: cough, shortness of breath, or wheezing CV: chest pain or dyspnea on exertion GI: as per HPI GU: dysuria, trouble voiding, or hematuria MSK: joint pain or joint stiffness Neuro: TIA or stroke symptoms Derm: pruritus and skin lesion changes Psych: anxiety and depression  Objective   PE There were no vitals taken for this visit. Constitutional: NAD; conversant; no deformities Eyes: Moist conjunctiva; no lid lag; anicteric; PERRL Neck: Trachea midline; no thyromegaly Lungs: Normal respiratory effort; no tactile fremitus CV: RRR; no palpable thrills; no pitting edema GI: Abd Soft, nontender; no palpable  hepatosplenomegaly MSK: Normal range of motion of extremities; no clubbing/cyanosis Psychiatric: Appropriate affect; alert and oriented x3 Lymphatic: No palpable cervical or axillary lymphadenopathy  No results found for this or any previous visit (from the past 24 hours).  Imaging Orders  No imaging studies ordered today   RUQ [place] 07/07/23 Cholelithiasis without secondary signs of acute cholecystitis. 2. Increased hepatic parenchymal echogenicity suggestive of steatosis. 3. Splenomegaly.  CBD 2.6 mm  CT abd/pel 04/07/22 Hepatobiliary: Pre contrast images demonstrate diffusely decreased hepatic density consistent with steatosis. Following contrast, no focal lesion or abnormal enhancement identified. Small dependent calcified gallstones in the gallbladder neck. No gallbladder wall thickening, surrounding inflammation or biliary dilatation.  Latest Reference Range & Units 07/05/23 15:14  Sodium 135 - 145 mEq/L 138  Potassium 3.5 - 5.1 mEq/L 3.9  Chloride 96 - 112 mEq/L 99  CO2 19 - 32 mEq/L 29  Glucose 70 - 99 mg/dL 78  BUN 6 - 23 mg/dL 19  Creatinine 1.61 - 0.96 mg/dL 0.45  Calcium 8.4 - 40.9 mg/dL 9.6  Alkaline Phosphatase 39 - 117 U/L 75  Albumin 3.5 - 5.2 g/dL 4.8  Lipase 81.1 - 91.4 U/L 29.0  AST 0 - 37 U/L 20  ALT 0 - 53 U/L 25  Total Protein 6.0 - 8.3 g/dL 8.0  Total Bilirubin 0.2 - 1.2 mg/dL 1.2  GFR >78.29 mL/min 101.10  WBC 4.0 - 10.5 K/uL 10.5  RBC 4.22 - 5.81 Mil/uL 5.46  Hemoglobin 13.0 - 17.0 g/dL 56.2  HCT 13.0 - 86.5 % 47.8  MCV 78.0 -  100.0 fl 87.6  MCHC 30.0 - 36.0 g/dL 78.2  RDW 95.6 - 21.3 % 12.5  Platelets 150.0 - 400.0 K/uL 337.0  Neutrophils 43.0 - 77.0 % 61.6  Lymphocytes 12.0 - 46.0 % 24.2  Monocytes Relative 3.0 - 12.0 % 9.9  Eosinophil 0.0 - 5.0 % 3.7  Basophil 0.0 - 3.0 % 0.6  NEUT# 1.4 - 7.7 K/uL 6.5  Lymphs Abs 0.7 - 4.0 K/uL 2.5  Monocyte # 0.1 - 1.0 K/uL 1.0  Eosinophils Absolute 0.0 - 0.7 K/uL 0.4  Basophils Absolute 0.0 - 0.1  K/uL 0.1     Assessment and Plan   Mr. Johnathan Bailey has been noticing some right upper quadrant abdominal pain consistent with biliary colic and has gallstones on imaging. I recommended laparoscopic cholecystectomy. We discussed the procedure itself as well as its risk, benefits, and alternatives. Risk discussed included were not limited to the risk of infection, bleeding, damage nearby structures, postoperative bile leak, and bile duct injury. After full discussion all questions answered the patient granted consent to proceed. We will proceed as scheduled.   Quentin Ore, MD  [facility] Use [email or web address] to contact on call provider

## 2023-08-18 ENCOUNTER — Encounter (HOSPITAL_COMMUNITY): Payer: Self-pay | Admitting: Surgery

## 2023-08-18 NOTE — Anesthesia Postprocedure Evaluation (Signed)
Anesthesia Post Note  Patient: Johnathan Bailey  Procedure(s) Performed: LAPAROSCOPIC CHOLECYSTECTOMY (Abdomen)     Patient location during evaluation: PACU Anesthesia Type: General Level of consciousness: awake and alert Pain management: pain level controlled Vital Signs Assessment: post-procedure vital signs reviewed and stable Respiratory status: spontaneous breathing, nonlabored ventilation and respiratory function stable Cardiovascular status: blood pressure returned to baseline and stable Postop Assessment: no apparent nausea or vomiting Anesthetic complications: no   No notable events documented.  Last Vitals:  Vitals:   08/17/23 1630 08/17/23 1700  BP:  128/69  Pulse: 95 93  Resp: 20 17  Temp:  37.4 C  SpO2: 94% 97%    Last Pain:  Vitals:   08/17/23 1700  TempSrc:   PainSc: 0-No pain                 Lealon Vanputten,W. EDMOND

## 2023-08-19 LAB — SURGICAL PATHOLOGY

## 2024-05-18 ENCOUNTER — Ambulatory Visit (INDEPENDENT_AMBULATORY_CARE_PROVIDER_SITE_OTHER): Admitting: Internal Medicine

## 2024-05-18 ENCOUNTER — Encounter: Payer: Self-pay | Admitting: Internal Medicine

## 2024-05-18 VITALS — BP 128/72 | HR 84 | Temp 98.0°F | Ht 73.0 in | Wt 266.2 lb

## 2024-05-18 DIAGNOSIS — K760 Fatty (change of) liver, not elsewhere classified: Secondary | ICD-10-CM | POA: Diagnosis not present

## 2024-05-18 DIAGNOSIS — R109 Unspecified abdominal pain: Secondary | ICD-10-CM | POA: Diagnosis not present

## 2024-05-18 DIAGNOSIS — N200 Calculus of kidney: Secondary | ICD-10-CM | POA: Insufficient documentation

## 2024-05-18 DIAGNOSIS — E041 Nontoxic single thyroid nodule: Secondary | ICD-10-CM | POA: Diagnosis not present

## 2024-05-18 LAB — COMPREHENSIVE METABOLIC PANEL WITH GFR
ALT: 29 U/L (ref 0–53)
AST: 19 U/L (ref 0–37)
Albumin: 4.7 g/dL (ref 3.5–5.2)
Alkaline Phosphatase: 67 U/L (ref 39–117)
BUN: 16 mg/dL (ref 6–23)
CO2: 29 meq/L (ref 19–32)
Calcium: 9.3 mg/dL (ref 8.4–10.5)
Chloride: 99 meq/L (ref 96–112)
Creatinine, Ser: 0.89 mg/dL (ref 0.40–1.50)
GFR: 98.39 mL/min (ref >60.00)
Glucose, Bld: 94 mg/dL (ref 70–99)
Potassium: 4.1 meq/L (ref 3.5–5.1)
Sodium: 135 meq/L (ref 135–145)
Total Bilirubin: 1 mg/dL (ref 0.2–1.2)
Total Protein: 7.7 g/dL (ref 6.0–8.3)

## 2024-05-18 LAB — CBC WITH DIFFERENTIAL/PLATELET
Basophils Absolute: 0.1 K/uL (ref 0.0–0.1)
Basophils Relative: 0.8 % (ref 0.0–3.0)
Eosinophils Absolute: 0.4 K/uL (ref 0.0–0.7)
Eosinophils Relative: 4.9 % (ref 0.0–5.0)
HCT: 46.1 % (ref 39.0–52.0)
Hemoglobin: 15.7 g/dL (ref 13.0–17.0)
Lymphocytes Relative: 28 % (ref 12.0–46.0)
Lymphs Abs: 2.2 K/uL (ref 0.7–4.0)
MCHC: 34.1 g/dL (ref 30.0–36.0)
MCV: 85.6 fl (ref 78.0–100.0)
Monocytes Absolute: 0.6 K/uL (ref 0.1–1.0)
Monocytes Relative: 7.1 % (ref 3.0–12.0)
Neutro Abs: 4.7 K/uL (ref 1.4–7.7)
Neutrophils Relative %: 59.2 % (ref 43.0–77.0)
Platelets: 324 K/uL (ref 150.0–400.0)
RBC: 5.38 Mil/uL (ref 4.22–5.81)
RDW: 12.5 % (ref 11.5–15.5)
WBC: 7.9 K/uL (ref 4.0–10.5)

## 2024-05-18 NOTE — Progress Notes (Signed)
 [facility] [address] [place] [address] Dept: [phone] Dept Fax: [phone]   Office Visit  Subjective:   Johnathan Bailey May 11, 1971 05/18/2024  Chief Complaint  Patient presents with   Abdominal Pain    Pain on right side goes into back, told he might have kidney stones and fatty liver    HPI:   History of Present Illness   Johnathan Bailey is a 53 year old male who presents with persistent right-sided abdominal pain post-cholecystectomy.  He underwent a cholecystectomy approximately ten months ago for gallstones. Despite the surgery, he has persistent right-sided abdominal pain that radiates to his back. The pain is sharp and is pretty much always there; the patient reports it is about the same as when it started, though possibly a little heavier. No nausea, vomiting, diarrhea, blood in stool, or urinary issues are present. The pain does not seem to be exacerbated by physical activity or specific foods, although he suspects it might be related to eating.  He was informed of having a fatty liver and right kidney stones following a health screening at his [employer]. He has a history of alcohol consumption, typically drinking four to six beers and some tequila on Saturdays, but he states he has reduced his intake. .  A recent health screening at his [employer] revealed a nodule on the left side of his thyroid . He is unsure about the implications of this finding and seeks further clarification.       The following portions of the patient's history were reviewed and updated as appropriate: past medical history, past surgical history, family history, social history, allergies, medications, and problem list.   Patient Active Problem List   Diagnosis Date Noted   Thyroid  nodule 05/18/2024   Kidney stone on right side 05/18/2024   Foot pain 10/01/2021   Change in voice 10/01/2021   Renal mass 04/03/2021   Fatty liver  04/03/2021   Calculus of gallbladder without cholecystitis without obstruction 03/20/2021   Right upper quadrant abdominal pain 03/13/2021   Class 2 obesity due to excess calories with body mass index (BMI) of 35.0 to 35.9 in adult 10/19/2019   Elevated triglycerides with high cholesterol 11/10/2018   Cervical strain 12/07/2017   Metatarsalgia of right foot 06/30/2017   Healthcare maintenance 06/30/2017   Past Medical History:  Diagnosis Date   Fatty liver    Hyperlipidemia    Past Surgical History:  Procedure Laterality Date   CHOLECYSTECTOMY N/A 08/17/2023   Procedure: LAPAROSCOPIC CHOLECYSTECTOMY;  Surgeon: Lyndel Deward PARAS, MD;  Location: [facility];  Service: General;  Laterality: N/A;   HERNIA REPAIR     KNEE ARTHROSCOPY W/ MENISCAL REPAIR Right    x2   Family History  Problem Relation Age of Onset   Colon cancer Neg Hx    Colon polyps Neg Hx    Esophageal cancer Neg Hx    Rectal cancer Neg Hx    Stomach cancer Neg Hx     Current Outpatient Medications:    oxyCODONE -acetaminophen  (PERCOCET) 5-325 MG tablet, Take 1 tablet by mouth every 4 (four) hours as needed for severe pain (pain score 7-10). (Patient not taking: Reported on 05/18/2024), Disp: 20 tablet, Rfl: 0 No Known Allergies   ROS: A complete ROS was performed with pertinent positives/negatives noted in the HPI. The remainder of the ROS are negative.    Objective:   Today's Vitals   05/18/24 1327  BP: 128/72  Pulse: 84  Temp: 98 F (36.7 C)  TempSrc: Temporal  SpO2: 95%  Weight: 266 lb 3.2 oz (120.7 kg)  Height: 6' 1 (1.854 m)    GENERAL: Well-appearing, in NAD. Well nourished.  SKIN: Pink, warm and dry. No rash, lesion, ulceration, or ecchymoses.  NECK: Trachea midline. Full ROM w/o pain or tenderness. No lymphadenopathy. No thyromegaly or palpable masses.  RESPIRATORY: Chest wall symmetrical. Respirations even and non-labored. Breath sounds clear to auscultation bilaterally.  CARDIAC: S1, S2  present, regular rate and rhythm. Peripheral pulses 2+ bilaterally.  GI: Abdomen soft, mild tenderness to right mid abdomen. Normoactive bowel sounds. No rebound tenderness. No hepatomegaly or splenomegaly. No CVA tenderness.  EXTREMITIES: Without clubbing, cyanosis, or edema.  NEUROLOGIC: No motor or sensory deficits. Steady, even gait.  PSYCH/MENTAL STATUS: Alert, oriented x 3. Cooperative, appropriate mood and affect.       Assessment & Plan:   Assessment and Plan    Right kidney stone(s) Right kidney stones detected, possibly causing right-sided abdominal pain. Further imaging required to assess location and obstruction risk. - Order abdominal ultrasound to assess kidney stones and liver. - Check urine sample to evaluate kidney function.  Fatty liver - Advise reduction and cessation of alcohol consumption. - Encourage diet and exercise modifications. - Check CMP - US  abdomen   Right upper abdominal pain Persistent pain post-cholecystectomy, possibly linked to kidney stones or fatty liver. Further evaluation needed. - Order abdominal ultrasound to evaluate liver and kidney stones. - CBC, CMP.  Left thyroid  nodule Left thyroid  nodule detected, requires evaluation to exclude malignancy. - Order thyroid  ultrasound to assess nodule size and characteristics. - Check thyroid  function through blood work.     No orders of the defined types were placed in this encounter.  Orders Placed This Encounter  Procedures   US  THYROID     Standing Status:   Future    Expiration Date:   05/18/2025    Reason for Exam (SYMPTOM  OR DIAGNOSIS REQUIRED):   left thyroid  nodule on [facility] screening    Preferred imaging location?:   [facility]   US  Abdomen Complete    Standing Status:   Future    Expiration Date:   05/18/2025    Reason for Exam (SYMPTOM  OR DIAGNOSIS REQUIRED):   right sided abdominal pain post gallbladder removal . Also kidney stone on right side found with a  [facility] health screening    Preferred imaging location?:   [facility]   TSH Rfx on Abnormal to Free T4   Comp Met (CMET)   Urinalysis w microscopic + reflex cultur   CBC with Differential/Platelet   Lab Orders         TSH Rfx on Abnormal to Free T4         Comp Met (CMET)         Urinalysis w microscopic + reflex cultur         CBC with Differential/Platelet     No images are attached to the encounter or orders placed in the encounter.  Return if symptoms worsen or fail to improve.   Rosina Senters, FNP

## 2024-05-19 LAB — URINALYSIS W MICROSCOPIC + REFLEX CULTURE
Bacteria, UA: NONE SEEN /HPF
Bilirubin Urine: NEGATIVE
Glucose, UA: NEGATIVE
Hgb urine dipstick: NEGATIVE
Hyaline Cast: NONE SEEN /LPF
Ketones, ur: NEGATIVE
Leukocyte Esterase: NEGATIVE
Nitrites, Initial: NEGATIVE
Protein, ur: NEGATIVE
RBC / HPF: NONE SEEN /HPF (ref 0–2)
Specific Gravity, Urine: 1.013 (ref 1.001–1.035)
Squamous Epithelial / HPF: NONE SEEN /HPF (ref <=5)
WBC, UA: NONE SEEN /HPF (ref 0–5)
pH: 6 (ref 5.0–8.0)

## 2024-05-19 LAB — NO CULTURE INDICATED

## 2024-05-19 LAB — TSH RFX ON ABNORMAL TO FREE T4: TSH: 1.31 u[IU]/mL (ref 0.450–4.500)

## 2024-05-23 ENCOUNTER — Ambulatory Visit: Payer: Self-pay | Admitting: Internal Medicine

## 2024-06-02 ENCOUNTER — Ambulatory Visit (HOSPITAL_BASED_OUTPATIENT_CLINIC_OR_DEPARTMENT_OTHER)

## 2024-06-02 ENCOUNTER — Other Ambulatory Visit: Payer: Self-pay

## 2024-06-02 ENCOUNTER — Emergency Department (HOSPITAL_BASED_OUTPATIENT_CLINIC_OR_DEPARTMENT_OTHER)

## 2024-06-02 ENCOUNTER — Emergency Department (HOSPITAL_BASED_OUTPATIENT_CLINIC_OR_DEPARTMENT_OTHER)
Admission: EM | Admit: 2024-06-02 | Discharge: 2024-06-02 | Disposition: A | Discharge status: Home / Self Care | Attending: Emergency Medicine | Admitting: Emergency Medicine

## 2024-06-02 ENCOUNTER — Encounter (HOSPITAL_BASED_OUTPATIENT_CLINIC_OR_DEPARTMENT_OTHER): Payer: Self-pay

## 2024-06-02 DIAGNOSIS — Z87442 Personal history of urinary calculi: Secondary | ICD-10-CM | POA: Diagnosis not present

## 2024-06-02 DIAGNOSIS — R10A1 Flank pain, right side: Secondary | ICD-10-CM | POA: Diagnosis not present

## 2024-06-02 DIAGNOSIS — R109 Unspecified abdominal pain: Secondary | ICD-10-CM

## 2024-06-02 DIAGNOSIS — R1031 Right lower quadrant pain: Secondary | ICD-10-CM | POA: Insufficient documentation

## 2024-06-02 DIAGNOSIS — R1011 Right upper quadrant pain: Secondary | ICD-10-CM | POA: Insufficient documentation

## 2024-06-02 LAB — CBC
HCT: 45.8 % (ref 39.0–52.0)
Hemoglobin: 16 g/dL (ref 13.0–17.0)
MCH: 29.6 pg (ref 26.0–34.0)
MCHC: 34.9 g/dL (ref 30.0–36.0)
MCV: 84.8 fL (ref 80.0–100.0)
Platelets: 273 K/uL (ref 150–400)
RBC: 5.4 MIL/uL (ref 4.22–5.81)
RDW: 12 % (ref 11.5–15.5)
WBC: 7.3 K/uL (ref 4.0–10.5)
nRBC: 0 % (ref 0.0–0.2)

## 2024-06-02 LAB — URINALYSIS, ROUTINE W REFLEX MICROSCOPIC
Bilirubin Urine: NEGATIVE
Glucose, UA: NEGATIVE mg/dL
Ketones, ur: NEGATIVE mg/dL
Leukocytes,Ua: NEGATIVE
Nitrite: NEGATIVE
Protein, ur: NEGATIVE mg/dL
Specific Gravity, Urine: 1.025 (ref 1.005–1.030)
pH: 6 (ref 5.0–8.0)

## 2024-06-02 LAB — COMPREHENSIVE METABOLIC PANEL WITH GFR
ALT: 29 U/L (ref 0–44)
AST: 24 U/L (ref 15–41)
Albumin: 4.6 g/dL (ref 3.5–5.0)
Alkaline Phosphatase: 72 U/L (ref 38–126)
Anion gap: 11 (ref 5–15)
BUN: 14 mg/dL (ref 6–20)
CO2: 25 mmol/L (ref 22–32)
Calcium: 9.3 mg/dL (ref 8.9–10.3)
Chloride: 103 mmol/L (ref 98–111)
Creatinine, Ser: 0.73 mg/dL (ref 0.61–1.24)
GFR, Estimated: >60 mL/min (ref >60)
Glucose, Bld: 109 mg/dL — ABNORMAL HIGH (ref 70–99)
Potassium: 4.2 mmol/L (ref 3.5–5.1)
Sodium: 139 mmol/L (ref 135–145)
Total Bilirubin: 1.5 mg/dL — ABNORMAL HIGH (ref 0.0–1.2)
Total Protein: 7.5 g/dL (ref 6.5–8.1)

## 2024-06-02 LAB — T4, FREE: Free T4: 0.75 ng/dL (ref 0.61–1.12)

## 2024-06-02 LAB — TSH: TSH: 1.44 u[IU]/mL (ref 0.350–4.500)

## 2024-06-02 LAB — URINALYSIS, MICROSCOPIC (REFLEX)

## 2024-06-02 LAB — LIPASE, BLOOD: Lipase: 25 U/L (ref 11–51)

## 2024-06-02 MED ORDER — IOHEXOL 300 MG/ML  SOLN
100.0000 mL | Freq: Once | INTRAMUSCULAR | Status: AC | PRN
  Administered 2024-06-02: 100 mL via INTRAVENOUS

## 2024-06-02 NOTE — ED Provider Notes (Signed)
 [place] [facility] Provider Note   CSN: [id number] Arrival date & time: 06/02/24  9162     Patient presents with: Abdominal Pain and Flank Pain   Johnathan Bailey is a 53 y.o. male.   The history is provided by the patient and medical records. No language interpreter was used.  Abdominal Pain Pain location:  R flank, RLQ and RUQ Pain quality: aching and dull   Pain radiates to:  Does not radiate Pain severity:  Moderate Onset quality:  Gradual Duration:  52 weeks Timing:  Intermittent Progression:  Waxing and waning Chronicity:  Chronic Context: not trauma   Relieved by:  Nothing Worsened by:  Nothing Associated symptoms: no chest pain, no chills, no constipation, no cough, no diarrhea, no dysuria, no fatigue, no fever, no nausea, no shortness of breath and no vomiting   Flank Pain Associated symptoms include abdominal pain. Pertinent negatives include no chest pain, no headaches and no shortness of breath.       Prior to Admission medications   Medication Sig Start Date End Date Taking? Authorizing Provider  oxyCODONE -acetaminophen  (PERCOCET) 5-325 MG tablet Take 1 tablet by mouth every 4 (four) hours as needed for severe pain (pain score 7-10). Patient not taking: Reported on 05/18/2024 08/17/23 08/16/24  Stechschulte, Deward PARAS, MD    Allergies: Patient has no known allergies.    Review of Systems  Constitutional:  Negative for chills, fatigue and fever.  HENT:  Negative for congestion.   Respiratory:  Negative for cough, chest tightness and shortness of breath.   Cardiovascular:  Negative for chest pain, palpitations and leg swelling.  Gastrointestinal:  Positive for abdominal pain. Negative for constipation, diarrhea, nausea and vomiting.  Genitourinary:  Positive for flank pain. Negative for dysuria, frequency, penile discharge, penile pain, penile swelling, scrotal swelling and testicular pain.  Musculoskeletal:  Positive for  back pain. Negative for neck pain and neck stiffness.  Skin:  Negative for rash and wound.  Neurological:  Negative for headaches.  Psychiatric/Behavioral:  Negative for agitation.   All other systems reviewed and are negative.   Updated Vital Signs BP (!) 141/84   Pulse 73   Temp 98.5 F (36.9 C) (Oral)   Resp 16   SpO2 96%   Physical Exam Vitals and nursing note reviewed.  Constitutional:      General: He is not in acute distress.    Appearance: He is well-developed. He is not ill-appearing, toxic-appearing or diaphoretic.  HENT:     Head: Normocephalic and atraumatic.  Eyes:     General: No scleral icterus.    Extraocular Movements: Extraocular movements intact.     Conjunctiva/sclera: Conjunctivae normal.  Cardiovascular:     Rate and Rhythm: Normal rate and regular rhythm.     Heart sounds: No murmur heard. Pulmonary:     Effort: Pulmonary effort is normal. No respiratory distress.     Breath sounds: Normal breath sounds. No wheezing, rhonchi or rales.  Chest:     Chest wall: No tenderness.  Abdominal:     General: Abdomen is flat. Bowel sounds are normal.     Palpations: Abdomen is soft.     Tenderness: There is no abdominal tenderness. There is no right CVA tenderness, left CVA tenderness, guarding or rebound.  Genitourinary:    Comments: Deferred by patient with no groin symptoms Musculoskeletal:        General: No swelling.     Cervical back: Neck supple.  Skin:    General: Skin is warm and dry.     Capillary Refill: Capillary refill takes less than 2 seconds.  Neurological:     General: No focal deficit present.     Mental Status: He is alert.  Psychiatric:        Mood and Affect: Mood normal.     (all labs ordered are listed, but only abnormal results are displayed) Labs Reviewed  COMPREHENSIVE METABOLIC PANEL WITH GFR - Abnormal; Notable for the following components:      Result Value   Glucose, Bld 109 (*)    Total Bilirubin 1.5 (*)    All  other components within normal limits  URINALYSIS, ROUTINE W REFLEX MICROSCOPIC - Abnormal; Notable for the following components:   Hgb urine dipstick TRACE (*)    All other components within normal limits  URINALYSIS, MICROSCOPIC (REFLEX) - Abnormal; Notable for the following components:   Bacteria, UA RARE (*)    All other components within normal limits  LIPASE, BLOOD  CBC  TSH  T4, FREE    EKG: None  Radiology: CT ABDOMEN PELVIS W CONTRAST Result Date: 06/02/2024 EXAM: CT ABDOMEN AND PELVIS WITH CONTRAST 06/02/2024 10:15:10 AM TECHNIQUE: CT of the abdomen and pelvis was performed with the administration of 100 mL of iohexol  (OMNIPAQUE ) 300 MG/ML solution. Multiplanar reformatted images are provided for review. Automated exposure control, iterative reconstruction, and/or weight-based adjustment of the mA/kV was utilized to reduce the radiation dose to as low as reasonably achievable. COMPARISON: CT abdomen pelvis dated 04/07/2022. CLINICAL HISTORY: Right sided abdominal pain and right flank pain. Sent by PCP to get CT scan. Known fatty liver disease and previous kidney stones. FINDINGS: LOWER CHEST: No acute abnormality. LIVER: The liver is unremarkable. GALLBLADDER AND BILE DUCTS: Post cholecystectomy. No biliary ductal dilatation. SPLEEN: No acute abnormality. PANCREAS: No acute abnormality. ADRENAL GLANDS: No acute abnormality. KIDNEYS, URETERS AND BLADDER: Mild mid right renal cortical scarring. Scattered subcentimeter hypodense bilateral renal cortical lesions are too small to characterize, for which no follow up imaging is recommended. No stones in the kidneys or ureters. No hydronephrosis. No perinephric or periureteral stranding. Urinary bladder is unremarkable. GI AND BOWEL: Stomach demonstrates no acute abnormality. Moderate left colon diverticulosis. There is no bowel obstruction. Normal appendix. PERITONEUM AND RETROPERITONEUM: No ascites. No free air. VASCULATURE: Aorta is normal in  caliber. Aortic atherosclerosis. LYMPH NODES: No lymphadenopathy. REPRODUCTIVE ORGANS: Mildly enlarged prostate gland. BONES AND SOFT TISSUES: Mild degenerative spine changes. Small fat containing right periumbilical hernia. No acute osseous abnormality. No focal soft tissue abnormality. IMPRESSION: 1. No acute findings in the abdomen or pelvis. No hydronephrosis. Normal appendix. 2. Moderate left colon diverticulosis without evidence of diverticulitis. 3. Aortic Atherosclerosis (ICD10-I70.0). Electronically signed by: Selinda Blue MD 06/02/2024 10:44 AM EST RP Workstation: [id number]     Procedures   Medications Ordered in the ED  iohexol  (OMNIPAQUE ) 300 MG/ML solution 100 mL (100 mLs Intravenous Contrast Given 06/02/24 1006)                                    Medical Decision Making Amount and/or Complexity of Data Reviewed Labs: ordered. Radiology: ordered.  Risk Prescription drug management.    KEATYN JAWAD is a 53 y.o. male with a past medical history significant for previous cholecystectomy, hyperlipidemia, fatty liver disease, kidney stones, and chronic right side abdominal pain who was sent by PCP for CT  scan to rule out concerning etiology of persistent abdominal pain.  According to patient, for the last year or so he has had right-sided abdominal pain and flank pain.  He had his gallbladder removed when they saw stones thinking that was causing it but it has persisted.  He reports it is mild to moderate and waxes and wanes.  He reports no nausea, vomiting, constipation, diarrhea, or urinary changes.  Denies trauma.  Denies rashes or shingles.  Denies fevers or chills.  Denies congestion, cough, or other URI symptoms.  He denies hematuria.  Denies testicle groin or scrotal symptoms.  He reports the pain is dull and aching and he feels on his right hemiabdomen going around towards his back.  No leg pain or leg swelling and denies any neurologic complaints.  He was post have  appointments PCP today but when he called them they told him to come to the emergency department for CT imaging.  On my exam, lungs clear.  Chest nontender.  Abdomen is not tender on his abdomen, flank or back.  No CVA tenderness.  No rashes seen.  Good bowel sounds.  Exam is otherwise completely reassuring.  He has symmetric pulses in lower extremities and no significant tenderness or edema in his legs.  We did have a shared decision-making conversation on management and we will get the CT scan his PCP was wanting given his persistent symptoms and he will get some screening labs and urinalysis.  Patient also says he is worried about his thyroid  with some fatigue and a reportedly previous thyroid  ultrasound that was abnormal.  Will add on a TSH and a free T4 although they will likely not return today and patient understands to follow-up with his PCP for this information.  Anticipate reassessment after CT scan and if it is reassuring, anticipate discharge home for outpatient follow-up.  1:34 PM Workup and CT scan reassuring.  No evidence of acute surgical problem on the CT scan to determine the etiology.  No evidence of bleeding.  Given reassuring workup, we feel he is safe for discharge to continue outpatient workup with his PCP.  Thyroid  test not yet returned he can follow-up with PCP for this.  Patient agrees with plan and will discharge for outpatient follow-up.       Final diagnoses:  Right-sided abdominal pain of unknown cause    ED Discharge Orders     None       Clinical Impression: 1. Right-sided abdominal pain of unknown cause     Disposition: Discharge  Condition: Good  I have discussed the results, Dx and Tx plan with the pt(& family if present). He/she/they expressed understanding and agree(s) with the plan. Discharge instructions discussed at great length. Strict return precautions discussed and pt &/or family have verbalized understanding of the instructions. No  further questions at time of discharge.    New Prescriptions   No medications on file    Follow Up: Berneta Elsie Sayre, MD [address] [place] [address] [phone]     [facility] Emergency Department at [facility] [address] [place]  [address] [phone]        Sirinity Outland, Lonni PARAS, MD 06/02/24 [id number]

## 2024-06-02 NOTE — Discharge Instructions (Signed)
 Your history, exam, workup today not reveal a clear answer as to your chronic abdominal pain you been having.  Your primary doctor wanted imaging we did a CT scan that did not show a clear cause.  Your labs are otherwise similar to prior and reassuring.  We feel you are safe for discharge home to follow-up with your primary doctor.  Your thyroid  testing was still in process so please follow-up on those results as well.  If any symptoms change or worsen acutely, please return to the nearest Emergency Department.

## 2024-06-02 NOTE — ED Notes (Signed)
 Patient given snack upon request and MD approval

## 2024-06-02 NOTE — ED Triage Notes (Signed)
 R sided abd and flank pain for over 1 year. States the pain is constant. Denies NVD, constipation or urinary symptoms
